# Patient Record
Sex: Female | Born: 1975 | Race: Black or African American | Hispanic: No | Marital: Single | Smoking: Former smoker
Health system: Southern US, Community
[De-identification: ages and names within clinical notes are randomized; demographics above are authoritative.]

## PROBLEM LIST (undated history)

## (undated) ENCOUNTER — Inpatient Hospital Stay (HOSPITAL_COMMUNITY): Payer: Self-pay

## (undated) DIAGNOSIS — R519 Headache, unspecified: Secondary | ICD-10-CM

## (undated) DIAGNOSIS — F32A Depression, unspecified: Secondary | ICD-10-CM

## (undated) DIAGNOSIS — E282 Polycystic ovarian syndrome: Secondary | ICD-10-CM

## (undated) DIAGNOSIS — R51 Headache: Secondary | ICD-10-CM

## (undated) DIAGNOSIS — M5430 Sciatica, unspecified side: Secondary | ICD-10-CM

## (undated) DIAGNOSIS — F329 Major depressive disorder, single episode, unspecified: Secondary | ICD-10-CM

## (undated) DIAGNOSIS — R87629 Unspecified abnormal cytological findings in specimens from vagina: Secondary | ICD-10-CM

## (undated) DIAGNOSIS — O24419 Gestational diabetes mellitus in pregnancy, unspecified control: Secondary | ICD-10-CM

## (undated) DIAGNOSIS — D649 Anemia, unspecified: Secondary | ICD-10-CM

## (undated) DIAGNOSIS — I10 Essential (primary) hypertension: Secondary | ICD-10-CM

## (undated) HISTORY — PX: DILATION AND CURETTAGE OF UTERUS: SHX78

## (undated) HISTORY — DX: Depression, unspecified: F32.A

## (undated) HISTORY — PX: COLPOSCOPY: SHX161

## (undated) HISTORY — DX: Major depressive disorder, single episode, unspecified: F32.9

---

## 1998-02-16 ENCOUNTER — Emergency Department (HOSPITAL_COMMUNITY): Admission: EM | Admit: 1998-02-16 | Discharge: 1998-02-16 | Payer: Self-pay | Admitting: Family Medicine

## 1998-06-04 ENCOUNTER — Emergency Department (HOSPITAL_COMMUNITY): Admission: EM | Admit: 1998-06-04 | Discharge: 1998-06-04 | Payer: Self-pay | Admitting: Emergency Medicine

## 1998-06-04 ENCOUNTER — Encounter: Payer: Self-pay | Admitting: Emergency Medicine

## 1999-10-12 ENCOUNTER — Emergency Department (HOSPITAL_COMMUNITY): Admission: EM | Admit: 1999-10-12 | Discharge: 1999-10-12 | Payer: Self-pay | Admitting: Emergency Medicine

## 2000-07-22 ENCOUNTER — Emergency Department (HOSPITAL_COMMUNITY): Admission: EM | Admit: 2000-07-22 | Discharge: 2000-07-23 | Payer: Self-pay | Admitting: Emergency Medicine

## 2000-08-23 ENCOUNTER — Emergency Department (HOSPITAL_COMMUNITY): Admission: EM | Admit: 2000-08-23 | Discharge: 2000-08-23 | Payer: Self-pay | Admitting: Emergency Medicine

## 2000-11-21 ENCOUNTER — Emergency Department (HOSPITAL_COMMUNITY): Admission: EM | Admit: 2000-11-21 | Discharge: 2000-11-21 | Payer: Self-pay | Admitting: Emergency Medicine

## 2005-09-04 ENCOUNTER — Encounter: Admission: RE | Admit: 2005-09-04 | Discharge: 2005-09-04 | Payer: Self-pay | Admitting: Occupational Medicine

## 2006-03-29 ENCOUNTER — Emergency Department (HOSPITAL_COMMUNITY): Admission: EM | Admit: 2006-03-29 | Discharge: 2006-03-29 | Payer: Self-pay | Admitting: Emergency Medicine

## 2006-08-21 ENCOUNTER — Encounter: Admission: RE | Admit: 2006-08-21 | Discharge: 2006-08-21 | Payer: Self-pay | Admitting: Internal Medicine

## 2007-10-25 ENCOUNTER — Emergency Department (HOSPITAL_COMMUNITY): Admission: EM | Admit: 2007-10-25 | Discharge: 2007-10-25 | Payer: Self-pay | Admitting: Emergency Medicine

## 2008-01-17 ENCOUNTER — Emergency Department (HOSPITAL_COMMUNITY): Admission: EM | Admit: 2008-01-17 | Discharge: 2008-01-17 | Payer: Self-pay | Admitting: Emergency Medicine

## 2008-01-22 ENCOUNTER — Emergency Department (HOSPITAL_COMMUNITY): Admission: EM | Admit: 2008-01-22 | Discharge: 2008-01-22 | Payer: Self-pay | Admitting: Emergency Medicine

## 2008-04-30 ENCOUNTER — Encounter: Payer: Self-pay | Admitting: Gynecology

## 2008-04-30 ENCOUNTER — Other Ambulatory Visit: Admission: RE | Admit: 2008-04-30 | Discharge: 2008-04-30 | Payer: Self-pay | Admitting: Gynecology

## 2008-04-30 ENCOUNTER — Ambulatory Visit: Payer: Self-pay | Admitting: Gynecology

## 2008-05-05 ENCOUNTER — Ambulatory Visit: Payer: Self-pay | Admitting: Gynecology

## 2008-08-13 ENCOUNTER — Emergency Department (HOSPITAL_COMMUNITY): Admission: EM | Admit: 2008-08-13 | Discharge: 2008-08-13 | Payer: Self-pay | Admitting: Emergency Medicine

## 2008-08-20 ENCOUNTER — Ambulatory Visit: Payer: Self-pay | Admitting: Gynecology

## 2008-08-24 ENCOUNTER — Ambulatory Visit: Payer: Self-pay | Admitting: Gynecology

## 2009-05-18 ENCOUNTER — Other Ambulatory Visit: Admission: RE | Admit: 2009-05-18 | Discharge: 2009-05-18 | Payer: Self-pay | Admitting: Gynecology

## 2009-05-18 ENCOUNTER — Ambulatory Visit: Payer: Self-pay | Admitting: Gynecology

## 2009-05-19 ENCOUNTER — Encounter (INDEPENDENT_AMBULATORY_CARE_PROVIDER_SITE_OTHER): Payer: Self-pay | Admitting: *Deleted

## 2009-06-20 ENCOUNTER — Ambulatory Visit: Payer: Self-pay | Admitting: Gastroenterology

## 2009-09-21 ENCOUNTER — Emergency Department (HOSPITAL_COMMUNITY): Admission: EM | Admit: 2009-09-21 | Discharge: 2009-09-21 | Payer: Self-pay | Admitting: Emergency Medicine

## 2009-09-23 ENCOUNTER — Ambulatory Visit: Payer: Self-pay | Admitting: Gynecology

## 2009-10-14 ENCOUNTER — Ambulatory Visit: Payer: Self-pay | Admitting: Gynecology

## 2009-12-30 ENCOUNTER — Emergency Department (HOSPITAL_COMMUNITY): Admission: EM | Admit: 2009-12-30 | Discharge: 2009-12-30 | Payer: Self-pay | Admitting: Emergency Medicine

## 2010-02-23 ENCOUNTER — Emergency Department (HOSPITAL_COMMUNITY): Admission: EM | Admit: 2010-02-23 | Discharge: 2009-09-23 | Payer: Self-pay | Admitting: Emergency Medicine

## 2010-02-23 ENCOUNTER — Emergency Department (HOSPITAL_COMMUNITY): Admission: EM | Admit: 2010-02-23 | Discharge: 2010-01-02 | Payer: Self-pay | Admitting: Emergency Medicine

## 2010-03-19 DIAGNOSIS — O42112 Preterm premature rupture of membranes, onset of labor more than 24 hours following rupture, second trimester: Secondary | ICD-10-CM

## 2010-04-20 NOTE — Letter (Signed)
Summary: New Patient letter  [facility]  [address] [place], [place] [address]   Phone: [phone]  Fax: [phone]       05/19/2009 MRN: [id number]  Alexandra [name] [address] [place], [place]  [address]  Dear Ms. Schiano,  Welcome to the Gastroenterology Division at [facility].    You are scheduled to see Dr.  Christella Hartigan on 06-17-09 at 10:30am on the 3rd floor at [facility], [address]  We ask that you try to arrive at our office 15 minutes prior to your appointment time to allow for check-in.  We would like you to complete the enclosed self-administered evaluation form prior to your visit and bring it with you on the day of your appointment.  We will review it with you.  Also, please bring a complete list of all your medications or, if you prefer, bring the medication bottles and we will list them.  Please bring your insurance card so that we may make a copy of it.  If your insurance requires a referral to see a specialist, please bring your referral form from your primary care physician.  Co-payments are due at the time of your visit and may be paid by cash, check or credit card.     Your office visit will consist of a consult with your physician (includes a physical exam), any laboratory testing he/she may order, scheduling of any necessary diagnostic testing (e.g. x-ray, ultrasound, CT-scan), and scheduling of a procedure (e.g. Endoscopy, Colonoscopy) if required.  Please allow enough time on your schedule to allow for any/all of these possibilities.    If you cannot keep your appointment, please call [phone] to cancel or reschedule prior to your appointment date.  This allows [place] the opportunity to schedule an appointment for another patient in need of care.  If you do not cancel or reschedule by 5 p.m. the business day prior to your appointment date, you will be charged a $50.00 late cancellation/no-show fee.    Thank you for  choosing [facility] for your medical needs.  We appreciate the opportunity to care for you.  Please visit [place] at our website  to learn more about our practice.                     Sincerely,                                                             The Gastroenterology Division

## 2010-05-23 ENCOUNTER — Other Ambulatory Visit (HOSPITAL_COMMUNITY)
Admission: RE | Admit: 2010-05-23 | Discharge: 2010-05-23 | Disposition: A | Discharge status: Home / Self Care | Payer: 59 | Source: Ambulatory Visit | Attending: Gynecology | Admitting: Gynecology

## 2010-05-23 ENCOUNTER — Encounter (INDEPENDENT_AMBULATORY_CARE_PROVIDER_SITE_OTHER): Payer: BC Managed Care – PPO | Admitting: Gynecology

## 2010-05-23 ENCOUNTER — Other Ambulatory Visit: Payer: Self-pay | Admitting: Gynecology

## 2010-05-23 DIAGNOSIS — Z124 Encounter for screening for malignant neoplasm of cervix: Secondary | ICD-10-CM | POA: Insufficient documentation

## 2010-05-23 DIAGNOSIS — Z833 Family history of diabetes mellitus: Secondary | ICD-10-CM

## 2010-05-23 DIAGNOSIS — Z01419 Encounter for gynecological examination (general) (routine) without abnormal findings: Secondary | ICD-10-CM

## 2010-05-23 DIAGNOSIS — Z13220 Encounter for screening for lipoid disorders: Secondary | ICD-10-CM

## 2010-05-23 DIAGNOSIS — R823 Hemoglobinuria: Secondary | ICD-10-CM

## 2010-05-23 DIAGNOSIS — N926 Irregular menstruation, unspecified: Secondary | ICD-10-CM

## 2010-05-23 DIAGNOSIS — B373 Candidiasis of vulva and vagina: Secondary | ICD-10-CM

## 2010-05-31 LAB — BASIC METABOLIC PANEL
CO2: 27 mEq/L (ref 19–32)
Chloride: 106 mEq/L (ref 96–112)
Creatinine, Ser: 0.81 mg/dL (ref 0.4–1.2)
GFR calc Af Amer: >60 mL/min (ref >60)

## 2010-05-31 LAB — CBC
MCH: 28.9 pg (ref 26.0–34.0)
MCV: 86.8 fL (ref 78.0–100.0)
Platelets: 215 10*3/uL (ref 150–400)
RBC: 4.46 MIL/uL (ref 3.87–5.11)

## 2010-05-31 LAB — URINALYSIS, ROUTINE W REFLEX MICROSCOPIC
Bilirubin Urine: NEGATIVE
Glucose, UA: NEGATIVE mg/dL
Hgb urine dipstick: NEGATIVE
Ketones, ur: NEGATIVE mg/dL
Protein, ur: NEGATIVE mg/dL

## 2010-05-31 LAB — PREGNANCY, URINE: Preg Test, Ur: NEGATIVE

## 2010-05-31 LAB — TROPONIN I: Troponin I: 0.02 ng/mL (ref 0.00–0.06)

## 2010-05-31 LAB — DIFFERENTIAL
Basophils Absolute: 0.1 10*3/uL (ref 0.0–0.1)
Basophils Relative: 1 % (ref 0–1)
Eosinophils Absolute: 0.1 10*3/uL (ref 0.0–0.7)
Eosinophils Relative: 1 % (ref 0–5)
Lymphocytes Relative: 22 % (ref 12–46)
Lymphs Abs: 1.9 10*3/uL (ref 0.7–4.0)
Monocytes Absolute: 0.5 10*3/uL (ref 0.1–1.0)
Monocytes Relative: 6 % (ref 3–12)
Neutro Abs: 6.1 10*3/uL (ref 1.7–7.7)
Neutrophils Relative %: 70 % (ref 43–77)

## 2010-05-31 LAB — D-DIMER, QUANTITATIVE: D-Dimer, Quant: <0.22 ug/mL-FEU (ref 0.00–0.48)

## 2010-06-04 LAB — STREP A DNA PROBE

## 2010-06-27 LAB — URINE MICROSCOPIC-ADD ON

## 2010-06-27 LAB — GC/CHLAMYDIA PROBE AMP, GENITAL: Chlamydia, DNA Probe: NEGATIVE

## 2010-06-27 LAB — URINALYSIS, ROUTINE W REFLEX MICROSCOPIC
Bilirubin Urine: NEGATIVE
Glucose, UA: NEGATIVE mg/dL
Ketones, ur: NEGATIVE mg/dL
pH: 6 (ref 5.0–8.0)

## 2010-06-27 LAB — CBC
MCHC: 32.5 g/dL (ref 30.0–36.0)
RBC: 4.68 MIL/uL (ref 3.87–5.11)
WBC: 7.3 10*3/uL (ref 4.0–10.5)

## 2010-06-27 LAB — URINE CULTURE: Culture: NO GROWTH

## 2010-06-27 LAB — WET PREP, GENITAL

## 2010-12-15 LAB — PREGNANCY, URINE: Preg Test, Ur: NEGATIVE

## 2010-12-15 LAB — URINE MICROSCOPIC-ADD ON

## 2010-12-15 LAB — WET PREP, GENITAL
Trich, Wet Prep: NONE SEEN
Yeast Wet Prep HPF POC: NONE SEEN

## 2010-12-15 LAB — POCT I-STAT, CHEM 8
BUN: 16
Calcium, Ion: 1.18
Creatinine, Ser: 1
Sodium: 137
TCO2: 26

## 2010-12-15 LAB — URINALYSIS, ROUTINE W REFLEX MICROSCOPIC
Ketones, ur: NEGATIVE
Leukocytes, UA: NEGATIVE
Nitrite: NEGATIVE
Urobilinogen, UA: 0.2
pH: 5.5

## 2010-12-15 LAB — PROTIME-INR: INR: 0.9

## 2010-12-15 LAB — APTT: aPTT: 31

## 2013-03-02 ENCOUNTER — Encounter (HOSPITAL_COMMUNITY): Payer: Self-pay | Admitting: Emergency Medicine

## 2013-03-02 DIAGNOSIS — Z3202 Encounter for pregnancy test, result negative: Secondary | ICD-10-CM | POA: Insufficient documentation

## 2013-03-02 DIAGNOSIS — H53149 Visual discomfort, unspecified: Secondary | ICD-10-CM | POA: Insufficient documentation

## 2013-03-02 DIAGNOSIS — F17200 Nicotine dependence, unspecified, uncomplicated: Secondary | ICD-10-CM | POA: Insufficient documentation

## 2013-03-02 DIAGNOSIS — J329 Chronic sinusitis, unspecified: Secondary | ICD-10-CM | POA: Insufficient documentation

## 2013-03-02 DIAGNOSIS — J3489 Other specified disorders of nose and nasal sinuses: Secondary | ICD-10-CM | POA: Insufficient documentation

## 2013-03-02 DIAGNOSIS — R5381 Other malaise: Secondary | ICD-10-CM | POA: Insufficient documentation

## 2013-03-02 DIAGNOSIS — R197 Diarrhea, unspecified: Secondary | ICD-10-CM | POA: Insufficient documentation

## 2013-03-02 DIAGNOSIS — R112 Nausea with vomiting, unspecified: Secondary | ICD-10-CM | POA: Insufficient documentation

## 2013-03-02 LAB — CBC WITH DIFFERENTIAL/PLATELET
Eosinophils Absolute: 0 10*3/uL (ref 0.0–0.7)
Eosinophils Relative: 0 % (ref 0–5)
Lymphs Abs: 0.6 10*3/uL — ABNORMAL LOW (ref 0.7–4.0)
MCH: 29.4 pg (ref 26.0–34.0)
MCV: 86.9 fL (ref 78.0–100.0)
Monocytes Absolute: 1 10*3/uL (ref 0.1–1.0)
Platelets: 148 10*3/uL — ABNORMAL LOW (ref 150–400)
RBC: 4.29 MIL/uL (ref 3.87–5.11)

## 2013-03-02 LAB — BASIC METABOLIC PANEL
BUN: 6 mg/dL (ref 6–23)
Calcium: 8.5 mg/dL (ref 8.4–10.5)
Creatinine, Ser: 0.76 mg/dL (ref 0.50–1.10)
GFR calc non Af Amer: >90 mL/min (ref >90)
Glucose, Bld: 97 mg/dL (ref 70–99)
Sodium: 134 mEq/L — ABNORMAL LOW (ref 135–145)

## 2013-03-02 MED ORDER — SODIUM CHLORIDE 0.9 % IV SOLN
Freq: Once | INTRAVENOUS | Status: AC
  Administered 2013-03-03: 01:00:00 via INTRAVENOUS

## 2013-03-02 NOTE — ED Notes (Signed)
Patient presents with c/o headache, flu like symptoms, weakness

## 2013-03-03 ENCOUNTER — Emergency Department (HOSPITAL_COMMUNITY)
Admission: EM | Admit: 2013-03-03 | Discharge: 2013-03-03 | Disposition: A | Discharge status: Home / Self Care | Payer: Self-pay | Attending: Emergency Medicine | Admitting: Emergency Medicine

## 2013-03-03 ENCOUNTER — Emergency Department (HOSPITAL_COMMUNITY): Payer: Self-pay

## 2013-03-03 DIAGNOSIS — J329 Chronic sinusitis, unspecified: Secondary | ICD-10-CM

## 2013-03-03 HISTORY — DX: Headache: R51

## 2013-03-03 HISTORY — DX: Headache, unspecified: R51.9

## 2013-03-03 LAB — HEPATIC FUNCTION PANEL
ALT: 9 U/L (ref 0–35)
AST: 16 U/L (ref 0–37)
Alkaline Phosphatase: 51 U/L (ref 39–117)
Bilirubin, Direct: <0.1 mg/dL (ref 0.0–0.3)

## 2013-03-03 LAB — URINALYSIS, ROUTINE W REFLEX MICROSCOPIC
Bilirubin Urine: NEGATIVE
Ketones, ur: 40 mg/dL — AB
Protein, ur: NEGATIVE mg/dL
Urobilinogen, UA: 1 mg/dL (ref 0.0–1.0)

## 2013-03-03 MED ORDER — METOCLOPRAMIDE HCL 5 MG/ML IJ SOLN
10.0000 mg | Freq: Once | INTRAMUSCULAR | Status: AC
  Administered 2013-03-03: 10 mg via INTRAVENOUS
  Filled 2013-03-03: qty 2

## 2013-03-03 MED ORDER — AZITHROMYCIN 250 MG PO TABS: ORAL_TABLET | ORAL | Status: DC

## 2013-03-03 MED ORDER — MOMETASONE FUROATE 50 MCG/ACT NA SUSP: 2.0000 | Freq: Every day | NASAL | Status: DC

## 2013-03-03 MED ORDER — KETOROLAC TROMETHAMINE 30 MG/ML IJ SOLN
30.0000 mg | Freq: Once | INTRAMUSCULAR | Status: AC
  Administered 2013-03-03: 30 mg via INTRAVENOUS
  Filled 2013-03-03: qty 1

## 2013-03-03 MED ORDER — IBUPROFEN 600 MG PO TABS: 600.0000 mg | ORAL_TABLET | Freq: Four times a day (QID) | ORAL | Status: DC | PRN

## 2013-03-03 MED ORDER — SODIUM CHLORIDE 0.9 % IV BOLUS (SEPSIS)
1000.0000 mL | Freq: Once | INTRAVENOUS | Status: AC
  Administered 2013-03-03: 1000 mL via INTRAVENOUS

## 2013-03-03 MED ORDER — OXYMETAZOLINE HCL 0.05 % NA SOLN: 1.0000 | Freq: Two times a day (BID) | NASAL | Status: DC

## 2013-03-03 MED ORDER — MORPHINE SULFATE 2 MG/ML IJ SOLN
2.0000 mg | Freq: Once | INTRAMUSCULAR | Status: AC
  Administered 2013-03-03: 2 mg via INTRAVENOUS
  Filled 2013-03-03: qty 1

## 2013-03-03 NOTE — ED Notes (Signed)
Patient transported to X-ray 

## 2013-03-03 NOTE — ED Provider Notes (Signed)
CSN: [id number]     Arrival date & time 03/02/13  2202 History   First MD Initiated Contact with Patient 03/03/13 0049     Chief Complaint  Patient presents with  . Headache  . Weakness   (Consider location/radiation/quality/duration/timing/severity/associated sxs/prior Treatment) HPI Patient presents with to 3 days of nasal congestion, sinus pressure, sore throat and cough. The cough is nonproductive and she has no fever. She does have some chest wall pain that is worse when coughing. She began to have multiple episodes of diarrhea and vomiting yesterday. She states that she developed a gradual onset bitemporal and facial headache. She does have some mild photophobia. The headache is much worse when she is bending forward. She has no neck pain or stiffness. She has no focal weakness or numbness although she does state that the area over her maxillary sinuses tingles. Her significant other has been sick with similar symptoms. Past Medical History  Diagnosis Date  . Frequent headaches    History reviewed. No pertinent past surgical history. History reviewed. No pertinent family history. History  Substance Use Topics  . Smoking status: Current Some Day Smoker  . Smokeless tobacco: Not on file  . Alcohol Use: Yes     Comment: ocassionally   OB History   Grav Para Term Preterm Abortions TAB SAB Ect Mult Living                 Review of Systems  Constitutional: Positive for fatigue. Negative for fever and chills.  HENT: Positive for congestion, rhinorrhea, sinus pressure and sore throat. Negative for facial swelling, trouble swallowing and voice change.   Eyes: Positive for photophobia.  Respiratory: Positive for cough. Negative for shortness of breath and wheezing.   Cardiovascular: Negative for chest pain, palpitations and leg swelling.  Gastrointestinal: Positive for nausea, vomiting and diarrhea. Negative for abdominal pain and blood in stool.  Genitourinary: Negative for dysuria,  hematuria and flank pain.  Musculoskeletal: Negative for back pain, myalgias, neck pain and neck stiffness.  Skin: Negative for rash and wound.  Neurological: Positive for headaches. Negative for dizziness, syncope, weakness, light-headedness and numbness.  All other systems reviewed and are negative.    Allergies  Review of patient's allergies indicates no known allergies.  Home Medications  No current outpatient prescriptions on file. BP 144/100  Pulse 103  Temp(Src) 98.5 F (36.9 C) (Oral)  Resp 22  Ht 6' (1.829 m)  Wt 165 lb 8 oz (75.07 kg)  BMI 22.44 kg/m2  SpO2 97%  LMP 02/16/2013 Physical Exam  Nursing note and vitals reviewed. Constitutional: She is oriented to person, place, and time. She appears well-developed and well-nourished. No distress.  HENT:  Head: Normocephalic and atraumatic.  Mouth/Throat: Oropharynx is clear and moist. No oropharyngeal exudate.  Bilateral nasal mucosal edema. Tenderness to percussion over bilateral maxillary sinuses.  Eyes: EOM are normal. Pupils are equal, round, and reactive to light.  Neck: Normal range of motion. Neck supple.  No meningismus  Cardiovascular: Normal rate and regular rhythm.   Pulmonary/Chest: Effort normal and breath sounds normal. No respiratory distress. She has no wheezes. She has no rales. She exhibits tenderness (mild tenderness to palpation over the left parasternal border. No crepitance or deformity.).  Abdominal: Soft. Bowel sounds are normal. She exhibits no distension and no mass. There is no tenderness. There is no rebound and no guarding.  Musculoskeletal: Normal range of motion. She exhibits no edema and no tenderness.  No lower extremity swelling or tenderness  Neurological: She is alert and oriented to person, place, and time.  Patient is alert and oriented x3 with clear, goal oriented speech. Patient has 5/5 motor in all extremities. Sensation is intact to light touch.    Skin: Skin is warm and dry. No  rash noted. No erythema.  Psychiatric: She has a normal mood and affect. Her behavior is normal.    ED Course  Procedures (including critical care time) Labs Review Labs Reviewed  CBC WITH DIFFERENTIAL - Abnormal; Notable for the following:    Platelets 148 (*)    Lymphs Abs 0.6 (*)    Monocytes Relative 23 (*)    All other components within normal limits  BASIC METABOLIC PANEL - Abnormal; Notable for the following:    Sodium 134 (*)    Potassium 3.4 (*)    All other components within normal limits  URINALYSIS, ROUTINE W REFLEX MICROSCOPIC  PREGNANCY, URINE  HEPATIC FUNCTION PANEL   Imaging Review No results found.  EKG Interpretation   None       MDM   Patient's symptoms have improved in the emergency department. Neurologic exam remains normal. We'll treat for sinusitis. Return precautions have been given.  Loren Racer, MD 03/03/13 [phone]

## 2013-03-03 NOTE — ED Notes (Signed)
Family at bedside. 

## 2013-10-09 ENCOUNTER — Ambulatory Visit: Payer: Self-pay | Admitting: Women's Health

## 2013-10-28 ENCOUNTER — Emergency Department (HOSPITAL_COMMUNITY)
Admission: EM | Admit: 2013-10-28 | Discharge: 2013-10-28 | Disposition: A | Discharge status: Home / Self Care | Payer: 59 | Attending: Emergency Medicine | Admitting: Emergency Medicine

## 2013-10-28 ENCOUNTER — Encounter (HOSPITAL_COMMUNITY): Payer: Self-pay | Admitting: Emergency Medicine

## 2013-10-28 DIAGNOSIS — Z792 Long term (current) use of antibiotics: Secondary | ICD-10-CM | POA: Insufficient documentation

## 2013-10-28 DIAGNOSIS — Z79899 Other long term (current) drug therapy: Secondary | ICD-10-CM | POA: Diagnosis not present

## 2013-10-28 DIAGNOSIS — S61209A Unspecified open wound of unspecified finger without damage to nail, initial encounter: Secondary | ICD-10-CM | POA: Insufficient documentation

## 2013-10-28 DIAGNOSIS — IMO0002 Reserved for concepts with insufficient information to code with codable children: Secondary | ICD-10-CM | POA: Insufficient documentation

## 2013-10-28 DIAGNOSIS — Z23 Encounter for immunization: Secondary | ICD-10-CM | POA: Diagnosis not present

## 2013-10-28 DIAGNOSIS — W269XXA Contact with unspecified sharp object(s), initial encounter: Secondary | ICD-10-CM | POA: Diagnosis not present

## 2013-10-28 DIAGNOSIS — S61219A Laceration without foreign body of unspecified finger without damage to nail, initial encounter: Secondary | ICD-10-CM

## 2013-10-28 DIAGNOSIS — Y9289 Other specified places as the place of occurrence of the external cause: Secondary | ICD-10-CM | POA: Diagnosis not present

## 2013-10-28 DIAGNOSIS — Y9389 Activity, other specified: Secondary | ICD-10-CM | POA: Insufficient documentation

## 2013-10-28 DIAGNOSIS — F17200 Nicotine dependence, unspecified, uncomplicated: Secondary | ICD-10-CM | POA: Diagnosis not present

## 2013-10-28 MED ORDER — NAPROXEN 500 MG PO TABS: 500.0000 mg | ORAL_TABLET | Freq: Two times a day (BID) | ORAL | Status: DC

## 2013-10-28 MED ORDER — TETANUS-DIPHTH-ACELL PERTUSSIS 5-2.5-18.5 LF-MCG/0.5 IM SUSP
0.5000 mL | Freq: Once | INTRAMUSCULAR | Status: AC
  Administered 2013-10-28: 0.5 mL via INTRAMUSCULAR
  Filled 2013-10-28: qty 0.5

## 2013-10-28 MED ORDER — BUPIVACAINE HCL (PF) 0.5 % IJ SOLN
10.0000 mL | Freq: Once | INTRAMUSCULAR | Status: DC
  Filled 2013-10-28: qty 10

## 2013-10-28 NOTE — ED Provider Notes (Signed)
CSN: [id number]     Arrival date & time 10/28/13  0830 History   First MD Initiated Contact with Patient 10/28/13 [phone]     Chief Complaint  Patient presents with  . Laceration     (Consider location/radiation/quality/duration/timing/severity/associated sxs/prior Treatment) HPI Alexandra [name] is a 38 y.o. female who presents to emergency department complaining of a laceration to the finger. Patient states that she cut it last evening around 8 PM on the window. She states that she's unable to keep the bleeding under control. States she's having pain to the laceration to the area. She denies any numbness or weakness distally. No difficulty moving finger. She has applied bandages to his, no other treatments tried.  Past Medical History  Diagnosis Date  . Frequent headaches    History reviewed. No pertinent past surgical history. History reviewed. No pertinent family history. History  Substance Use Topics  . Smoking status: Current Some Day Smoker  . Smokeless tobacco: Not on file  . Alcohol Use: Yes     Comment: ocassionally   OB History   Grav Para Term Preterm Abortions TAB SAB Ect Mult Living                 Review of Systems  Musculoskeletal: Positive for arthralgias.  Skin: Positive for wound.  Neurological: Negative for weakness and numbness.      Allergies  Lortab  Home Medications   Prior to Admission medications   Medication Sig Start Date End Date Taking? Authorizing Provider  Aspirin-Acetaminophen-Caffeine (GOODY HEADACHE PO) Take 1 Package by mouth daily as needed.    Historical Provider, MD  azithromycin (ZITHROMAX Z-PAK) 250 MG tablet 2 po day one, then 1 daily x 4 days 03/03/13   Loren Raceravid Yelverton, MD  ibuprofen (ADVIL,MOTRIN) 600 MG tablet Take 1 tablet (600 mg total) by mouth every 6 (six) hours as needed. 03/03/13   Loren Raceravid Yelverton, MD  mometasone (NASONEX) 50 MCG/ACT nasal spray Place 2 sprays into the nose daily. 03/03/13   Loren Raceravid Yelverton, MD   naproxen sodium (ANAPROX) 220 MG tablet Take 220 mg by mouth daily as needed (pain).    Historical Provider, MD  oxymetazoline (AFRIN NASAL SPRAY) 0.05 % nasal spray Place 1 spray into both nostrils 2 (two) times daily. 03/03/13   Loren Raceravid Yelverton, MD   BP 167/97  Pulse 90  Temp(Src) 97.6 F (36.4 C) (Oral)  Resp 18  SpO2 100%  LMP 10/20/2013 Physical Exam  Nursing note and vitals reviewed. Constitutional: She appears well-developed and well-nourished. No distress.  HENT:  Head: Normocephalic.  Eyes: Conjunctivae are normal.  Neck: Neck supple.  Cardiovascular: Normal rate.   Musculoskeletal: She exhibits no edema.  Neurological: She is alert.  Skin: Skin is warm and dry.  1 cm laceration to the dorsal aspect of the right middle finger over the PIP joint. Full range of motion of the finger, strength intact with flexion extension against resistance at all joints. Sensation intact distally. Cap refill less than 2 seconds distally to  Psychiatric: She has a normal mood and affect. Her behavior is normal.    ED Course  Procedures (including critical care time) Labs Review Labs Reviewed - No data to display  Imaging Review No results found.   EKG Interpretation None      LACERATION REPAIR Performed by: Lottie MusselKIRICHENKO, Makeya Hilgert A Authorized by: Jaynie CrumbleKIRICHENKO, Aylan Bayona A Consent: Verbal consent obtained. Risks and benefits: risks, benefits and alternatives were discussed Consent given by: patient Patient identity confirmed: provided demographic data  Prepped and Draped in normal sterile fashion Wound explored  Laceration Location: right middle finger  Laceration Length: 1cm  No Foreign Bodies seen or palpated  Anesthesia: local infiltration  Local anesthetic: lidocaine 2% wo epinephrine  Anesthetic total: 1 ml  Irrigation method: syringe Amount of cleaning: standard  Skin closure: prolene 5.0  Number of sutures: 3  Technique: simple interrupted  Patient tolerance:  Patient tolerated the procedure well with no immediate complications.  MDM   Final diagnoses:  Laceration of finger, initial encounter    Patient with small laceration to the dorsal right middle finger, over PIP joint. Based on examination no tendon, nerve, vascular injury. Wound. Poorly giving this happened yesterday. Closed with sutures. Lysed in a splint, since over the joint, will followup in 7-10 days for suture removal.  Filed Vitals:   10/28/13 0837 10/28/13 1016  BP: 167/97 130/100  Pulse: 90 75  Temp: 97.6 F (36.4 C) 98.1 F (36.7 C)  TempSrc: Oral Oral  Resp: 18 16  SpO2: 100% 99%        Alexandra Olshefski A Alexias Margerum, PA-C 10/28/13 1708

## 2013-10-28 NOTE — Discharge Instructions (Signed)
Keep splinted, elevate. Bacitracin topically twice a day. Follow up in 1 week for suture removal.   Laceration Care, Adult A laceration is a cut or lesion that goes through all layers of the skin and into the tissue just beneath the skin. TREATMENT  Some lacerations may not require closure. Some lacerations may not be able to be closed due to an increased risk of infection. It is important to see your caregiver as soon as possible after an injury to minimize the risk of infection and maximize the opportunity for successful closure. If closure is appropriate, pain medicines may be given, if needed. The wound will be cleaned to help prevent infection. Your caregiver will use stitches (sutures), staples, wound glue (adhesive), or skin adhesive strips to repair the laceration. These tools bring the skin edges together to allow for faster healing and a better cosmetic outcome. However, all wounds will heal with a scar. Once the wound has healed, scarring can be minimized by covering the wound with sunscreen during the day for 1 full year. HOME CARE INSTRUCTIONS  For sutures or staples:  Keep the wound clean and dry.  If you were given a bandage (dressing), you should change it at least once a day. Also, change the dressing if it becomes wet or dirty, or as directed by your caregiver.  Wash the wound with soap and water 2 times a day. Rinse the wound off with water to remove all soap. Pat the wound dry with a clean towel.  After cleaning, apply a thin layer of the antibiotic ointment as recommended by your caregiver. This will help prevent infection and keep the dressing from sticking.  You may shower as usual after the first 24 hours. Do not soak the wound in water until the sutures are removed.  Only take over-the-counter or prescription medicines for pain, discomfort, or fever as directed by your caregiver.  Get your sutures or staples removed as directed by your caregiver. For skin adhesive  strips:  Keep the wound clean and dry.  Do not get the skin adhesive strips wet. You may bathe carefully, using caution to keep the wound dry.  If the wound gets wet, pat it dry with a clean towel.  Skin adhesive strips will fall off on their own. You may trim the strips as the wound heals. Do not remove skin adhesive strips that are still stuck to the wound. They will fall off in time. For wound adhesive:  You may briefly wet your wound in the shower or bath. Do not soak or scrub the wound. Do not swim. Avoid periods of heavy perspiration until the skin adhesive has fallen off on its own. After showering or bathing, gently pat the wound dry with a clean towel.  Do not apply liquid medicine, cream medicine, or ointment medicine to your wound while the skin adhesive is in place. This may loosen the film before your wound is healed.  If a dressing is placed over the wound, be careful not to apply tape directly over the skin adhesive. This may cause the adhesive to be pulled off before the wound is healed.  Avoid prolonged exposure to sunlight or tanning lamps while the skin adhesive is in place. Exposure to ultraviolet light in the first year will darken the scar.  The skin adhesive will usually remain in place for 5 to 10 days, then naturally fall off the skin. Do not pick at the adhesive film. You may need a tetanus shot if:  You cannot remember when you had your last tetanus shot.  You have never had a tetanus shot. If you get a tetanus shot, your arm may swell, get red, and feel warm to the touch. This is common and not a problem. If you need a tetanus shot and you choose not to have one, there is a rare chance of getting tetanus. Sickness from tetanus can be serious. SEEK MEDICAL CARE IF:   You have redness, swelling, or increasing pain in the wound.  You see a red line that goes away from the wound.  You have yellowish-white fluid (pus) coming from the wound.  You have a  fever.  You notice a bad smell coming from the wound or dressing.  Your wound breaks open before or after sutures have been removed.  You notice something coming out of the wound such as wood or glass.  Your wound is on your hand or foot and you cannot move a finger or toe. SEEK IMMEDIATE MEDICAL CARE IF:   Your pain is not controlled with prescribed medicine.  You have severe swelling around the wound causing pain and numbness or a change in color in your arm, hand, leg, or foot.  Your wound splits open and starts bleeding.  You have worsening numbness, weakness, or loss of function of any joint around or beyond the wound.  You develop painful lumps near the wound or on the skin anywhere on your body. MAKE SURE YOU:   Understand these instructions.  Will watch your condition.  Will get help right away if you are not doing well or get worse. Document Released: 03/05/2005 Document Revised: 05/28/2011 Document Reviewed: 08/29/2010 [facility] Patient Information 2015 [place], [place]. This information is not intended to replace advice given to you by your health care provider. Make sure you discuss any questions you have with your health care provider.

## 2013-10-28 NOTE — ED Notes (Signed)
PA in suturing pt.

## 2013-10-28 NOTE — ED Notes (Signed)
Per pt sts that she cut right middle finger on window about 8 pm yesterday. sts that she has changed the bandage multiple times and has bled through. Bleeding currently controlled. sts the finger is stinging.

## 2013-10-29 NOTE — ED Provider Notes (Signed)
Medical screening examination/treatment/procedure(s) were performed by non-physician practitioner and as supervising physician I was immediately available for consultation/collaboration.   EKG Interpretation None        Mimie Goering, DO 10/29/13 1649 

## 2013-11-19 ENCOUNTER — Ambulatory Visit: Payer: Self-pay | Admitting: Gynecology

## 2013-12-02 ENCOUNTER — Ambulatory Visit: Payer: Self-pay | Admitting: Gynecology

## 2014-01-22 DIAGNOSIS — O0990 Supervision of high risk pregnancy, unspecified, unspecified trimester: Secondary | ICD-10-CM | POA: Insufficient documentation

## 2014-01-27 ENCOUNTER — Emergency Department (HOSPITAL_COMMUNITY)
Admission: EM | Admit: 2014-01-27 | Discharge: 2014-01-27 | Discharge status: Against Medical Advice | Payer: 59 | Attending: Emergency Medicine | Admitting: Emergency Medicine

## 2014-01-27 ENCOUNTER — Encounter (HOSPITAL_COMMUNITY): Payer: Self-pay | Admitting: *Deleted

## 2014-01-27 DIAGNOSIS — Z0000 Encounter for general adult medical examination without abnormal findings: Secondary | ICD-10-CM | POA: Insufficient documentation

## 2014-01-27 DIAGNOSIS — F17210 Nicotine dependence, cigarettes, uncomplicated: Secondary | ICD-10-CM | POA: Insufficient documentation

## 2014-01-27 DIAGNOSIS — Z3A00 Weeks of gestation of pregnancy not specified: Secondary | ICD-10-CM | POA: Diagnosis not present

## 2014-01-27 DIAGNOSIS — O99330 Smoking (tobacco) complicating pregnancy, unspecified trimester: Secondary | ICD-10-CM | POA: Insufficient documentation

## 2014-01-27 NOTE — ED Notes (Signed)
Pt has left the ED without being seen by a physician.

## 2014-01-27 NOTE — ED Notes (Signed)
Pt observed by RN leaving the ED; unsure of return; Md notified

## 2014-01-27 NOTE — ED Notes (Signed)
The pt saw her doctor in [place] on Friday and has a pos preg test.  She had blood drawn and [place] in [place] and was contacted today and sent here to get labs drawn  lmp oct 18

## 2014-01-27 NOTE — ED Provider Notes (Signed)
Pt presenting for lab drawn in setting of reported + pregnancy test. Pt LWBS prior to evaluation by myself.   [name] [name], MD 01/27/14 [phone]  Dione Boozeavid Glick, MD 01/27/14 [phone]

## 2014-02-03 ENCOUNTER — Inpatient Hospital Stay (HOSPITAL_COMMUNITY): Payer: 59

## 2014-02-03 ENCOUNTER — Encounter (HOSPITAL_COMMUNITY): Payer: Self-pay | Admitting: *Deleted

## 2014-02-03 ENCOUNTER — Inpatient Hospital Stay (HOSPITAL_COMMUNITY)
Admission: AD | Admit: 2014-02-03 | Discharge: 2014-02-03 | Disposition: A | Discharge status: Home / Self Care | Payer: 59 | Source: Ambulatory Visit | Attending: Obstetrics and Gynecology | Admitting: Obstetrics and Gynecology

## 2014-02-03 DIAGNOSIS — Z3A01 Less than 8 weeks gestation of pregnancy: Secondary | ICD-10-CM | POA: Diagnosis not present

## 2014-02-03 DIAGNOSIS — O99331 Smoking (tobacco) complicating pregnancy, first trimester: Secondary | ICD-10-CM | POA: Diagnosis not present

## 2014-02-03 DIAGNOSIS — O26891 Other specified pregnancy related conditions, first trimester: Secondary | ICD-10-CM | POA: Diagnosis not present

## 2014-02-03 DIAGNOSIS — O209 Hemorrhage in early pregnancy, unspecified: Secondary | ICD-10-CM | POA: Insufficient documentation

## 2014-02-03 LAB — CBC WITH DIFFERENTIAL/PLATELET
BASOS ABS: 0.1 10*3/uL (ref 0.0–0.1)
BASOS PCT: 1 % (ref 0–1)
EOS PCT: 3 % (ref 0–5)
Eosinophils Absolute: 0.3 10*3/uL (ref 0.0–0.7)
HEMATOCRIT: 37.1 % (ref 36.0–46.0)
Hemoglobin: 12.5 g/dL (ref 12.0–15.0)
Lymphocytes Relative: 42 % (ref 12–46)
Lymphs Abs: 4.4 10*3/uL — ABNORMAL HIGH (ref 0.7–4.0)
MCH: 29.2 pg (ref 26.0–34.0)
MCHC: 33.7 g/dL (ref 30.0–36.0)
MCV: 86.7 fL (ref 78.0–100.0)
MONO ABS: 0.8 10*3/uL (ref 0.1–1.0)
Monocytes Relative: 7 % (ref 3–12)
NEUTROS ABS: 5 10*3/uL (ref 1.7–7.7)
Neutrophils Relative %: 47 % (ref 43–77)
Platelets: 244 10*3/uL (ref 150–400)
RBC: 4.28 MIL/uL (ref 3.87–5.11)
RDW: 13.4 % (ref 11.5–15.5)
WBC: 10.6 10*3/uL — ABNORMAL HIGH (ref 4.0–10.5)

## 2014-02-03 LAB — URINE MICROSCOPIC-ADD ON

## 2014-02-03 LAB — POCT PREGNANCY, URINE: Preg Test, Ur: POSITIVE — AB

## 2014-02-03 LAB — URINALYSIS, ROUTINE W REFLEX MICROSCOPIC
Bilirubin Urine: NEGATIVE
GLUCOSE, UA: NEGATIVE mg/dL
KETONES UR: NEGATIVE mg/dL
LEUKOCYTES UA: NEGATIVE
Nitrite: NEGATIVE
PROTEIN: NEGATIVE mg/dL
Specific Gravity, Urine: 1.01 (ref 1.005–1.030)
UROBILINOGEN UA: 0.2 mg/dL (ref 0.0–1.0)
pH: 5.5 (ref 5.0–8.0)

## 2014-02-03 LAB — WET PREP, GENITAL
Clue Cells Wet Prep HPF POC: NONE SEEN
Trich, Wet Prep: NONE SEEN
WBC WET PREP: NONE SEEN
Yeast Wet Prep HPF POC: NONE SEEN

## 2014-02-03 LAB — HCG, QUANTITATIVE, PREGNANCY: HCG, BETA CHAIN, QUANT, S: 4489 m[IU]/mL — AB (ref <5)

## 2014-02-03 LAB — OB RESULTS CONSOLE GC/CHLAMYDIA
Chlamydia: NEGATIVE
GC PROBE AMP, GENITAL: NEGATIVE

## 2014-02-03 NOTE — Discharge Instructions (Signed)
First Trimester of Pregnancy The first trimester of pregnancy is from week 1 until the end of week 12 (months 1 through 3). During this time, your baby will begin to develop inside you. At 6-8 weeks, the eyes and face are formed, and the heartbeat can be seen on ultrasound. At the end of 12 weeks, all the baby's organs are formed. Prenatal care is all the medical care you receive before the birth of your baby. Make sure you get good prenatal care and follow all of your doctor's instructions. HOME CARE  Medicines  Take medicine only as told by your doctor. Some medicines are safe and some are not during pregnancy.  Take your prenatal vitamins as told by your doctor.  Take medicine that helps you poop (stool softener) as needed if your doctor says it is okay. Diet  Eat regular, healthy meals.  Your doctor will tell you the amount of weight gain that is right for you.  Avoid raw meat and uncooked cheese.  If you feel sick to your stomach (nauseous) or throw up (vomit):  Eat 4 or 5 small meals a day instead of 3 large meals.  Try eating a few soda crackers.  Drink liquids between meals instead of during meals.  If you have a hard time pooping (constipation):  Eat high-fiber foods like fresh vegetables, fruit, and whole grains.  Drink enough fluids to keep your pee (urine) clear or pale yellow. Activity and Exercise  Exercise only as told by your doctor. Stop exercising if you have cramps or pain in your lower belly (abdomen) or low back.  Try to avoid standing for long periods of time. Move your legs often if you must stand in one place for a long time.  Avoid heavy lifting.  Wear low-heeled shoes. Sit and stand up straight.  You can have sex unless your doctor tells you not to. Relief of Pain or Discomfort  Wear a good support bra if your breasts are sore.  Take warm water baths (sitz baths) to soothe pain or discomfort caused by hemorrhoids. Use hemorrhoid cream if your  doctor says it is okay.  Rest with your legs raised if you have leg cramps or low back pain.  Wear support hose if you have puffy, bulging veins (varicose veins) in your legs. Raise (elevate) your feet for 15 minutes, 3-4 times a day. Limit salt in your diet. Prenatal Care  Schedule your prenatal visits by the twelfth week of pregnancy.  Write down your questions. Take them to your prenatal visits.  Keep all your prenatal visits as told by your doctor. Safety  Wear your seat belt at all times when driving.  Make a list of emergency phone numbers. The list should include numbers for family, friends, the hospital, and police and fire departments. General Tips  Ask your doctor for a referral to a local prenatal class. Begin classes no later than at the start of month 6 of your pregnancy.  Ask for help if you need counseling or help with nutrition. Your doctor can give you advice or tell you where to go for help.  Do not use hot tubs, steam rooms, or saunas.  Do not douche or use tampons or scented sanitary pads.  Do not cross your legs for long periods of time.  Avoid litter boxes and soil used by cats.  Avoid all smoking, herbs, and alcohol. Avoid drugs not approved by your doctor.  Visit your dentist. At home, brush your teeth   with a soft toothbrush. Be gentle when you floss. GET HELP IF:  You are dizzy.  You have mild cramps or pressure in your lower belly.  You have a nagging pain in your belly area.  You continue to feel sick to your stomach, throw up, or have watery poop (diarrhea).  You have a bad smelling fluid coming from your vagina.  You have pain with peeing (urination).  You have increased puffiness (swelling) in your face, hands, legs, or ankles. GET HELP RIGHT AWAY IF:   You have a fever.  You are leaking fluid from your vagina.  You have spotting or bleeding from your vagina.  You have very bad belly cramping or pain.  You gain or lose weight  rapidly.  You throw up blood. It may look like coffee grounds.  You are around people who have German measles, fifth disease, or chickenpox.  You have a very bad headache.  You have shortness of breath.  You have any kind of trauma, such as from a fall or a car accident. Document Released: 08/22/2007 Document Revised: 07/20/2013 Document Reviewed: 01/13/2013 [employer] Patient Information 2015 [employer]. This information is not intended to replace advice given to you by your health care provider. Make sure you discuss any questions you have with your health care provider.  

## 2014-02-03 NOTE — MAU Provider Note (Signed)
History     CSN: [id number]  Arrival date and time: 02/03/14 4098   First Provider Initiated Contact with Patient 02/03/14 2241      Chief Complaint  Patient presents with  . Vaginal Bleeding  . Abdominal Pain   HPI  [name] is a 38 y.o. G3P0020 at [redacted]w[redacted]d who presents to MAU today with complaint of vaginal bleeding and lower abdominal pain. The patient states that she noted a scant amount of tan discharge yesterday and then pink today on her pad and with wiping. She states mild lower abdominal cramping. She denies vaginal discharge, fever or UTI symptoms. She states that she has been followed by MD in the [place] due to her poor obstetrical history. Patient states SAB as a teenager and SROM at 15 weeks with last pregnancy. She is currently taking progesterone.   OB History    Gravida Para Term Preterm AB TAB SAB Ectopic Multiple Living   3    2  2          Past Medical History  Diagnosis Date  . Frequent headaches     History reviewed. No pertinent past surgical history.  History reviewed. No pertinent family history.  History  Substance Use Topics  . Smoking status: Current Some Day Smoker  . Smokeless tobacco: Not on file  . Alcohol Use: Yes     Comment: ocassionally    Allergies:  Allergies  Allergen Reactions  . Lortab [Hydrocodone-Acetaminophen] Itching    Prescriptions prior to admission  Medication Sig Dispense Refill Last Dose  . Aspirin-Acetaminophen-Caffeine (GOODY HEADACHE PO) Take 1 Package by mouth daily as needed.   Past Month at Unknown time  . prenatal vitamin w/FE, FA (PRENATAL 1 + 1) 27-1 MG TABS tablet Take 1 tablet by mouth daily at 12 noon.   02/03/2014 at Unknown time  . progesterone (PROMETRIUM) 100 MG capsule Take 100 mg by mouth every morning.   02/03/2014 at Unknown time  . progesterone (PROMETRIUM) 200 MG capsule Take 200 mg by mouth at bedtime.   02/02/2014 at Unknown time  . azithromycin (ZITHROMAX Z-PAK) 250 MG tablet 2  po day one, then 1 daily x 4 days (Patient not taking: Reported on 01/27/2014) 5 tablet 0 Unknown at Unknown time  . ibuprofen (ADVIL,MOTRIN) 600 MG tablet Take 1 tablet (600 mg total) by mouth every 6 (six) hours as needed. (Patient not taking: Reported on 01/27/2014) 30 tablet 0 Unknown at Unknown time  . mometasone (NASONEX) 50 MCG/ACT nasal spray Place 2 sprays into the nose daily. (Patient not taking: Reported on 01/27/2014) 17 g 12 Unknown at Unknown time  . naproxen (NAPROSYN) 500 MG tablet Take 1 tablet (500 mg total) by mouth 2 (two) times daily. (Patient not taking: Reported on 01/27/2014) 30 tablet 0 Unknown at Unknown time  . naproxen sodium (ANAPROX) 220 MG tablet Take 220 mg by mouth daily as needed (pain).   More than a month at Unknown time  . oxymetazoline (AFRIN NASAL SPRAY) 0.05 % nasal spray Place 1 spray into both nostrils 2 (two) times daily. (Patient not taking: Reported on 01/27/2014) 30 mL 0 Unknown at Unknown time    Review of Systems  Constitutional: Negative for fever and malaise/fatigue.  Gastrointestinal: Positive for abdominal pain. Negative for nausea and vomiting.  Genitourinary:       + vaginal bleeding Neg - vaginal discharge   Physical Exam   Blood pressure 128/88, pulse 86, temperature 98 F (36.7 C), temperature source  Oral, resp. rate 18, height 5\' 11"  (1.803 m), weight 186 lb (84.369 kg), last menstrual period 12/26/2013, SpO2 99 %.  Physical Exam  Constitutional: She is oriented to person, place, and time. She appears well-developed and well-nourished. No distress.  HENT:  Head: Normocephalic.  Cardiovascular: Normal rate.   Respiratory: Effort normal.  GI: Soft. She exhibits no distension and no mass. There is no tenderness. There is no rebound and no guarding.  Genitourinary: Uterus is enlarged (slightly). Uterus is not tender. Cervix exhibits no motion tenderness, no discharge and no friability. Right adnexum displays no mass and no tenderness.  Left adnexum displays no mass and no tenderness. There is bleeding (very light pink) in the vagina. Vaginal discharge (small amount of thin, white discharge noted) found.  Neurological: She is alert and oriented to person, place, and time.  Skin: Skin is warm and dry. No erythema.  Psychiatric: She has a normal mood and affect.   Results for orders placed or performed during the hospital encounter of 02/03/14 (from the past 24 hour(s))  Urinalysis, Routine w reflex microscopic     Status: Abnormal   Collection Time: 02/03/14  8:10 PM  Result Value Ref Range   Color, Urine YELLOW YELLOW   APPearance CLEAR CLEAR   Specific Gravity, Urine 1.010 1.005 - 1.030   pH 5.5 5.0 - 8.0   Glucose, UA NEGATIVE NEGATIVE mg/dL   Hgb urine dipstick SMALL (A) NEGATIVE   Bilirubin Urine NEGATIVE NEGATIVE   Ketones, ur NEGATIVE NEGATIVE mg/dL   Protein, ur NEGATIVE NEGATIVE mg/dL   Urobilinogen, UA 0.2 0.0 - 1.0 mg/dL   Nitrite NEGATIVE NEGATIVE   Leukocytes, UA NEGATIVE NEGATIVE  Urine microscopic-add on     Status: Abnormal   Collection Time: 02/03/14  8:10 PM  Result Value Ref Range   Squamous Epithelial / LPF FEW (A) RARE   WBC, UA 0-2 <3 WBC/hpf   RBC / HPF 3-6 <3 RBC/hpf   Bacteria, UA FEW (A) RARE  Pregnancy, urine POC     Status: Abnormal   Collection Time: 02/03/14  8:24 PM  Result Value Ref Range   Preg Test, Ur POSITIVE (A) NEGATIVE  CBC with Differential     Status: Abnormal   Collection Time: 02/03/14  8:30 PM  Result Value Ref Range   WBC 10.6 (H) 4.0 - 10.5 K/uL   RBC 4.28 3.87 - 5.11 MIL/uL   Hemoglobin 12.5 12.0 - 15.0 g/dL   HCT 16.137.1 09.636.0 - 04.546.0 %   MCV 86.7 78.0 - 100.0 fL   MCH 29.2 26.0 - 34.0 pg   MCHC 33.7 30.0 - 36.0 g/dL   RDW 40.913.4 81.111.5 - 91.415.5 %   Platelets 244 150 - 400 K/uL   Neutrophils Relative % 47 43 - 77 %   Neutro Abs 5.0 1.7 - 7.7 K/uL   Lymphocytes Relative 42 12 - 46 %   Lymphs Abs 4.4 (H) 0.7 - 4.0 K/uL   Monocytes Relative 7 3 - 12 %   Monocytes  Absolute 0.8 0.1 - 1.0 K/uL   Eosinophils Relative 3 0 - 5 %   Eosinophils Absolute 0.3 0.0 - 0.7 K/uL   Basophils Relative 1 0 - 1 %   Basophils Absolute 0.1 0.0 - 0.1 K/uL  ABO/Rh     Status: None (Preliminary result)   Collection Time: 02/03/14  8:30 PM  Result Value Ref Range   ABO/RH(D) O POS   hCG, quantitative, pregnancy     Status:  Abnormal   Collection Time: 02/03/14  8:30 PM  Result Value Ref Range   hCG, Beta Chain, Quant, S 4489 (H) <5 mIU/mL  Wet prep, genital     Status: None   Collection Time: 02/03/14 10:51 PM  Result Value Ref Range   Yeast Wet Prep HPF POC NONE SEEN NONE SEEN   Trich, Wet Prep NONE SEEN NONE SEEN   Clue Cells Wet Prep HPF POC NONE SEEN NONE SEEN   WBC, Wet Prep HPF POC NONE SEEN NONE SEEN   US Ob Comp Less 14 Wks  02/03/2014   CLINICAL DATA:  Early pregnancy, vaginal bleeding, spotting, and cramping, quantitative beta HCG 4489 ; EGA [redacted] weeks 4 days by LMP of 12/26/2013  EXAM: OBSTETRIC <14 WK [place] AND TRANSVAGINAL OB US  TECHNIQUE: Both transabdominal and transvaginal ultrasound examinations were performed for complete evaluation of the gestation as well as the maternal uterus, adnexal regions, and pelvic cul-de-sac. Transvaginal technique was performed to assess early pregnancy.  COMPARISON:  None  FINDINGS: Intrauterine gestational sac: Visualized/normal in shape.  Yolk sac:  Present  Embryo:  Not identified  Cardiac Activity: N/A  Heart Rate:  N/A bpm  MSD:  8.1  mm   5 w   3  d              [place] EDC: 10/03/2014  Maternal uterus/adnexae:  No subchorionic hemorrhage.  LEFT ovary normal size and morphology, 5.2 x 2.2 x 2.8 cm.  RIGHT ovary normal size and morphology, 4.5 x 2.8 x 2.7 cm.  No adnexal masses or free pelvic fluid.  IMPRESSION: Intrauterine gestational sac containing a yolk sac but no fetal pole identified, unable to establish viability.  No other pelvic abnormality seen.   Electronically Signed   By: Ulyses Southward M.D.   On: 02/03/2014 22:18   US Ob  Transvaginal  02/03/2014   CLINICAL DATA:  Early pregnancy, vaginal bleeding, spotting, and cramping, quantitative beta HCG 4489 ; EGA [redacted] weeks 4 days by LMP of 12/26/2013  EXAM: OBSTETRIC <14 WK [place] AND TRANSVAGINAL OB US  TECHNIQUE: Both transabdominal and transvaginal ultrasound examinations were performed for complete evaluation of the gestation as well as the maternal uterus, adnexal regions, and pelvic cul-de-sac. Transvaginal technique was performed to assess early pregnancy.  COMPARISON:  None  FINDINGS: Intrauterine gestational sac: Visualized/normal in shape.  Yolk sac:  Present  Embryo:  Not identified  Cardiac Activity: N/A  Heart Rate:  N/A bpm  MSD:  8.1  mm   5 w   3  d              [place] EDC: 10/03/2014  Maternal uterus/adnexae:  No subchorionic hemorrhage.  LEFT ovary normal size and morphology, 5.2 x 2.2 x 2.8 cm.  RIGHT ovary normal size and morphology, 4.5 x 2.8 x 2.7 cm.  No adnexal masses or free pelvic fluid.  IMPRESSION: Intrauterine gestational sac containing a yolk sac but no fetal pole identified, unable to establish viability.  No other pelvic abnormality seen.   Electronically Signed   By: Ulyses Southward M.D.   On: 02/03/2014 22:18    MAU Course  Procedures None  MDM +UPT UA, wet prep, GC/Chlamydia, CBC, HIV, RPR, quant hCG and [place] today  Assessment and Plan  A: IUGS and YS at [redacted]w[redacted]d Abdominal pain in pregnancy, antepartum Vaginal bleeding in pregnancy prior to [redacted] weeks gestation Poor obstetrical history   P: Discharge home Bleeding precautions discussed Patient advised to  follow-up with OB provider as scheduled  Patient may return to MAU as needed or if her condition were to change or worsen   Marny LowensteinJulie N Charo Philipp, PA-C  02/03/2014, 11:45 PM

## 2014-02-03 NOTE — MAU Note (Signed)
Pt reports [facility], has history of ROM at 15 weeks with previous preg. Had BHCG done here in town last Thursday that was 412, progesterone level was low and he started her on PO progesterone. Today began having pinkish discharge and some cramping.

## 2014-02-04 LAB — ABO/RH: ABO/RH(D): O POS

## 2014-02-04 LAB — GC/CHLAMYDIA PROBE AMP
CT PROBE, AMP APTIMA: NEGATIVE
GC Probe RNA: NEGATIVE

## 2014-02-04 LAB — RPR

## 2014-02-04 LAB — HIV ANTIBODY (ROUTINE TESTING W REFLEX): HIV 1&2 Ab, 4th Generation: NONREACTIVE

## 2014-03-19 NOTE — L&D Delivery Note (Signed)
Delivery Note Patient labored down for 1.5h.  She was then 10/10/+1.  She pushed well for 25 minutes.  At 10:41 PM a viable female was delivered via Vaginal, Spontaneous Delivery (Presentation: Middle Occiput Anterior).  APGAR: 9, 9; weight  pending.   Placenta status: Intact, Spontaneous.  Cord: 3 vessels with the following complications: None.  Cord pH: n/a   Anesthesia: Epidural  Episiotomy: None Lacerations: ;Periurethral Suture Repair: 3.0 vicryl rapide Est. Blood Loss (mL): 124  Mom to postpartum.  Baby to Couplet care / Skin to Skin.  Alexandra [name] GEFFEL Darien Kading 09/27/2014, 11:29 PM

## 2014-03-23 ENCOUNTER — Inpatient Hospital Stay (HOSPITAL_COMMUNITY)
Admission: AD | Admit: 2014-03-23 | Discharge: 2014-03-23 | Disposition: A | Discharge status: Home / Self Care | Payer: Self-pay | Source: Ambulatory Visit | Attending: Obstetrics & Gynecology | Admitting: Obstetrics & Gynecology

## 2014-03-23 ENCOUNTER — Encounter (HOSPITAL_COMMUNITY): Payer: Self-pay | Admitting: *Deleted

## 2014-03-23 ENCOUNTER — Inpatient Hospital Stay (HOSPITAL_COMMUNITY): Payer: Self-pay

## 2014-03-23 DIAGNOSIS — O99331 Smoking (tobacco) complicating pregnancy, first trimester: Secondary | ICD-10-CM | POA: Insufficient documentation

## 2014-03-23 DIAGNOSIS — O43891 Other placental disorders, first trimester: Secondary | ICD-10-CM

## 2014-03-23 DIAGNOSIS — R109 Unspecified abdominal pain: Secondary | ICD-10-CM

## 2014-03-23 DIAGNOSIS — O208 Other hemorrhage in early pregnancy: Secondary | ICD-10-CM | POA: Insufficient documentation

## 2014-03-23 DIAGNOSIS — Z3A12 12 weeks gestation of pregnancy: Secondary | ICD-10-CM | POA: Insufficient documentation

## 2014-03-23 DIAGNOSIS — R1032 Left lower quadrant pain: Secondary | ICD-10-CM

## 2014-03-23 DIAGNOSIS — O26899 Other specified pregnancy related conditions, unspecified trimester: Secondary | ICD-10-CM

## 2014-03-23 DIAGNOSIS — O9989 Other specified diseases and conditions complicating pregnancy, childbirth and the puerperium: Secondary | ICD-10-CM

## 2014-03-23 LAB — URINALYSIS, ROUTINE W REFLEX MICROSCOPIC
BILIRUBIN URINE: NEGATIVE
Glucose, UA: 250 mg/dL — AB
KETONES UR: NEGATIVE mg/dL
Leukocytes, UA: NEGATIVE
Nitrite: NEGATIVE
Protein, ur: NEGATIVE mg/dL
Specific Gravity, Urine: >1.03 — ABNORMAL HIGH (ref 1.005–1.030)
UROBILINOGEN UA: 0.2 mg/dL (ref 0.0–1.0)
pH: 6 (ref 5.0–8.0)

## 2014-03-23 LAB — URINE MICROSCOPIC-ADD ON

## 2014-03-23 NOTE — MAU Note (Addendum)
LLQ, constant pinching twisting pulling pain .  Was a little nagging last night.  Also has a headache- day 6; hx of migraines- states this is diffferent

## 2014-03-23 NOTE — MAU Note (Signed)
Pt states she is having pain on her right side.Pt states FHR heard in area pt was feeling discomfort

## 2014-03-23 NOTE — Discharge Instructions (Signed)
Your pain was most likely caused by pulling on the round ligaments holding up your uterus. This is normal in pregnancy.   Abdominal Pain During Pregnancy Abdominal pain is common in pregnancy. Most of the time, it does not cause harm. There are many causes of abdominal pain. Some causes are more serious than others. Some of the causes of abdominal pain in pregnancy are easily diagnosed. Occasionally, the diagnosis takes time to understand. Other times, the cause is not determined. Abdominal pain can be a sign that something is very wrong with the pregnancy, or the pain may have nothing to do with the pregnancy at all. For this reason, always tell your health care provider if you have any abdominal discomfort. HOME CARE INSTRUCTIONS  Monitor your abdominal pain for any changes. The following actions may help to alleviate any discomfort you are experiencing:  Do not have sexual intercourse or put anything in your vagina until your symptoms go away completely.  Get plenty of rest until your pain improves.  Drink clear fluids if you feel nauseous. Avoid solid food as long as you are uncomfortable or nauseous.  Only take over-the-counter or prescription medicine as directed by your health care provider.  Keep all follow-up appointments with your health care provider. SEEK IMMEDIATE MEDICAL CARE IF:  You are bleeding, leaking fluid, or passing tissue from the vagina.  You have increasing pain or cramping.  You have persistent vomiting.  You have painful or bloody urination.  You have a fever.  You notice a decrease in your baby's movements.  You have extreme weakness or feel faint.  You have shortness of breath, with or without abdominal pain.  You develop a severe headache with abdominal pain.  You have abnormal vaginal discharge with abdominal pain.  You have persistent diarrhea.  You have abdominal pain that continues even after rest, or gets worse. MAKE SURE YOU:   Understand  these instructions.  Will watch your condition.  Will get help right away if you are not doing well or get worse. Document Released: 03/05/2005 Document Revised: 12/24/2012 Document Reviewed: 10/02/2012 [facility] Patient Information 2015 [place]. This information is not intended to replace advice given to you by your health care provider. Make sure you discuss any questions you have with your health care provider.

## 2014-03-23 NOTE — MAU Note (Signed)
Express concern, prior loss 12-15wks

## 2014-03-23 NOTE — MAU Provider Note (Signed)
Chief Complaint: Abdominal Pain  First Provider Initiated Contact with Patient 03/23/14 1815     SUBJECTIVE HPI: Alexandra [name] is a 39 y.o. G3P0020 at [redacted]w[redacted]d by LMP who presents with LLQ pain since last night. 4/10 on pain scale, twisting, pulling sensation mostly upon standing. Worse after she would sit for long stretches at work today. Getting care at [facility]. [place] and pelvic exam normal. Has spotting early in pregnancy, but none now. No fever, chills, vaginal discharge, urinary complaints, GI complaints. Last BM today. Very nervous because previous SAB were at this gestation.   Past Medical History  Diagnosis Date  . Frequent headaches    OB History  Gravida Para Term Preterm AB SAB TAB Ectopic Multiple Living  # Outcome Date GA Lbr Len/2nd Weight Sex Delivery Anes PTL Lv  3 Current           2 SAB           1 SAB              History reviewed. No pertinent past surgical history. History   Social History  . Marital Status: Single    Spouse Name: N/A    Number of Children: N/A  . Years of Education: N/A   Occupational History  . Not on file.   Social History Main Topics  . Smoking status: Current Some Day Smoker  . Smokeless tobacco: Not on file  . Alcohol Use: Yes     Comment: ocassionally  . Drug Use: No  . Sexual Activity: Yes    Birth Control/ Protection: None   Other Topics Concern  . Not on file   Social History Narrative   No current facility-administered medications on file prior to encounter.   Current Outpatient Prescriptions on File Prior to Encounter  Medication Sig Dispense Refill  . Aspirin-Acetaminophen-Caffeine (GOODY HEADACHE PO) Take 1 Package by mouth daily as needed.    Marland Kitchen azithromycin (ZITHROMAX Z-PAK) 250 MG tablet 2 po day one, then 1 daily x 4 days (Patient not taking: Reported on 01/27/2014) 5 tablet 0  . mometasone (NASONEX) 50 MCG/ACT nasal spray Place 2 sprays into the nose daily. (Patient not taking:  Reported on 01/27/2014) 17 g 12  . oxymetazoline (AFRIN NASAL SPRAY) 0.05 % nasal spray Place 1 spray into both nostrils 2 (two) times daily. (Patient not taking: Reported on 01/27/2014) 30 mL 0  . prenatal vitamin w/FE, FA (PRENATAL 1 + 1) 27-1 MG TABS tablet Take 1 tablet by mouth daily at 12 noon.    . progesterone (PROMETRIUM) 100 MG capsule Take 100 mg by mouth every morning.    . progesterone (PROMETRIUM) 200 MG capsule Take 200 mg by mouth at bedtime.     Allergies  Allergen Reactions  . Lortab [Hydrocodone-Acetaminophen] Itching    ROS: Pertinent pos items in HPI. Neg fever, chills, vaginal discharge, urinary complaints, GI complaints. Last BM today.   OBJECTIVE Blood pressure 143/92, pulse 103, temperature 97.8 F (36.6 C), temperature source Oral, resp. rate 18, last menstrual period 12/26/2013. GENERAL: Well-developed, well-nourished female in no acute distress.  HEENT: Normocephalic HEART: normal rate RESP: normal effort ABDOMEN: Soft, LLQ TTP. Pos BS x 4. No CVAT.  EXTREMITIES: Nontender, no edema NEURO: Alert and oriented SPECULUM EXAM: Declined due to recent exam. FHR 157 by doppler  LAB RESULTS Results for orders placed or performed during the hospital encounter of 03/23/14 (from  the past 24 hour(s))  Urinalysis, Routine w reflex microscopic     Status: Abnormal   Collection Time: 03/23/14  3:35 PM  Result Value Ref Range   Color, Urine YELLOW YELLOW   APPearance HAZY (A) CLEAR   Specific Gravity, Urine >1.030 (H) 1.005 - 1.030   pH 6.0 5.0 - 8.0   Glucose, UA 250 (A) NEGATIVE mg/dL   Hgb urine dipstick TRACE (A) NEGATIVE   Bilirubin Urine NEGATIVE NEGATIVE   Ketones, ur NEGATIVE NEGATIVE mg/dL   Protein, ur NEGATIVE NEGATIVE mg/dL   Urobilinogen, UA 0.2 0.0 - 1.0 mg/dL   Nitrite NEGATIVE NEGATIVE   Leukocytes, UA NEGATIVE NEGATIVE  Urine microscopic-add on     Status: Abnormal   Collection Time: 03/23/14  3:35 PM  Result Value Ref Range   Squamous  Epithelial / LPF FEW (A) RARE   WBC, UA 0-2 <3 WBC/hpf   Bacteria, UA FEW (A) RARE   Crystals CA OXALATE CRYSTALS (A) NEGATIVE    IMAGING [place] Ob Comp Less 14 Wks  03/23/2014   CLINICAL DATA:  Pregnant patient with left lower quadrant pain. Estimated gestational age [redacted] weeks 3 days by last menstrual period.  EXAM: OBSTETRIC <14 WK ULTRASOUND  TECHNIQUE: Transabdominal ultrasound was performed for evaluation of the gestation as well as the maternal uterus and adnexal regions.  COMPARISON:  Obstetric ultrasound 02/03/2014  FINDINGS: Intrauterine gestational sac: Visualized/normal in shape. No definite subchorionic hemorrhage.  Yolk sac:  Not definitively seen, likely due to gestational age.  Embryo:  Present, fetal cardiac motion is seen.  Cardiac Activity: Present.  Heart Rate: 158  bpm  CRL:   6.32  cm   12 w 5 d                  US EDC: 09/30/2014  Maternal uterus/adnexae: Both ovaries are identified and normal. Right ovary measures 3.9 x 1.8 x 2.8 cm. Left ovary measures 5.8 x 1.5 x 2.3 cm. Blood flow seen to both ovaries. No adnexal mass.  IMPRESSION: 1. Single live intrauterine gestation estimated gestational age [redacted] weeks 5 days for estimated date of delivery 09/30/2014. 2. Normal appearance of both ovaries.  No adnexal mass.   Electronically Signed   By: Rubye OaksMelanie  Ehinger M.D.   On: 03/23/2014 19:06    MAU COURSE  ASSESSMENT 1. Subchorionic hematoma, first trimester   2. LLQ pain-likely round ligament pain  3. Abdominal pain affecting pregnancy, antepartum     PLAN Discharge home in stable condition. Increase fluids and rest . Comfort measures.      Follow-up Information    [facility] Sanjuana MaeWALDA STACIA, MD.   Specialty:  Obstetrics and Gynecology   Why:  As scheduled or  As needed if symptoms worsen   Contact information:   [address] [place] [id number] [phone]       [facility] with [facility].   Why:   As needed in emergencies   Contact information:   [address] [id number] [place] [id number] [phone]       Medication List    STOP taking these medications        azithromycin 250 MG tablet  Commonly known as:  ZITHROMAX Z-PAK     GOODY HEADACHE PO      TAKE these medications        mometasone 50 MCG/ACT nasal spray  Commonly known as:  NASONEX  Place  2 sprays into the nose daily.     oxymetazoline 0.05 % nasal spray  Commonly known as:  AFRIN NASAL SPRAY  Place 1 spray into both nostrils 2 (two) times daily.     prenatal vitamin w/FE, FA 27-1 MG Tabs tablet  Take 1 tablet by mouth daily at 12 noon.     progesterone 100 MG capsule  Commonly known as:  PROMETRIUM  Take 100 mg by mouth every morning.     progesterone 200 MG capsule  Commonly known as:  PROMETRIUM  Take 200 mg by mouth at bedtime.       [place], [place] 03/23/2014  7:17 PM

## 2014-03-31 LAB — OB RESULTS CONSOLE ABO/RH: RH Type: POSITIVE

## 2014-03-31 LAB — OB RESULTS CONSOLE HIV ANTIBODY (ROUTINE TESTING): HIV: NONREACTIVE

## 2014-03-31 LAB — OB RESULTS CONSOLE RPR: RPR: NONREACTIVE

## 2014-03-31 LAB — OB RESULTS CONSOLE HEPATITIS B SURFACE ANTIGEN: HEP B S AG: NEGATIVE

## 2014-03-31 LAB — OB RESULTS CONSOLE ANTIBODY SCREEN: ANTIBODY SCREEN: NEGATIVE

## 2014-03-31 LAB — OB RESULTS CONSOLE RUBELLA ANTIBODY, IGM: Rubella: IMMUNE

## 2014-04-03 ENCOUNTER — Inpatient Hospital Stay (HOSPITAL_COMMUNITY)
Admission: AD | Admit: 2014-04-03 | Discharge: 2014-04-04 | Disposition: A | Discharge status: Home / Self Care | Payer: 59 | Source: Ambulatory Visit | Attending: Obstetrics | Admitting: Obstetrics

## 2014-04-03 ENCOUNTER — Encounter (HOSPITAL_COMMUNITY): Payer: Self-pay

## 2014-04-03 DIAGNOSIS — O9989 Other specified diseases and conditions complicating pregnancy, childbirth and the puerperium: Secondary | ICD-10-CM | POA: Diagnosis not present

## 2014-04-03 DIAGNOSIS — Z87891 Personal history of nicotine dependence: Secondary | ICD-10-CM | POA: Diagnosis not present

## 2014-04-03 DIAGNOSIS — Z3490 Encounter for supervision of normal pregnancy, unspecified, unspecified trimester: Secondary | ICD-10-CM

## 2014-04-03 DIAGNOSIS — G43009 Migraine without aura, not intractable, without status migrainosus: Secondary | ICD-10-CM | POA: Diagnosis present

## 2014-04-03 DIAGNOSIS — Z3A14 14 weeks gestation of pregnancy: Secondary | ICD-10-CM | POA: Insufficient documentation

## 2014-04-03 HISTORY — DX: Polycystic ovarian syndrome: E28.2

## 2014-04-03 HISTORY — DX: Anemia, unspecified: D64.9

## 2014-04-03 LAB — URINE MICROSCOPIC-ADD ON

## 2014-04-03 LAB — URINALYSIS, ROUTINE W REFLEX MICROSCOPIC
BILIRUBIN URINE: NEGATIVE
Glucose, UA: NEGATIVE mg/dL
Ketones, ur: NEGATIVE mg/dL
LEUKOCYTES UA: NEGATIVE
Nitrite: NEGATIVE
PH: 6 (ref 5.0–8.0)
Protein, ur: NEGATIVE mg/dL
Specific Gravity, Urine: 1.025 (ref 1.005–1.030)
UROBILINOGEN UA: 0.2 mg/dL (ref 0.0–1.0)

## 2014-04-03 NOTE — MAU Note (Signed)
Been having headaches.  Has tried Tylenol and caffeine, not working.  Tried hot shower, didn't work either.  Feels like sharp pains shooting through head.  No vaginal bleeding.  No abd pain.

## 2014-04-04 DIAGNOSIS — Z3490 Encounter for supervision of normal pregnancy, unspecified, unspecified trimester: Secondary | ICD-10-CM

## 2014-04-04 MED ORDER — PROMETHAZINE HCL 25 MG PO TABS: 25.0000 mg | ORAL_TABLET | Freq: Four times a day (QID) | ORAL | Status: DC | PRN

## 2014-04-04 MED ORDER — BUTALBITAL-APAP-CAFFEINE 50-325-40 MG PO TABS: 1.0000 | ORAL_TABLET | Freq: Four times a day (QID) | ORAL | Status: DC | PRN

## 2014-04-04 MED ORDER — PROMETHAZINE HCL 25 MG PO TABS
25.0000 mg | ORAL_TABLET | Freq: Once | ORAL | Status: AC
  Administered 2014-04-04: 25 mg via ORAL
  Filled 2014-04-04: qty 1

## 2014-04-04 MED ORDER — BUTALBITAL-APAP-CAFFEINE 50-325-40 MG PO TABS
2.0000 | ORAL_TABLET | Freq: Once | ORAL | Status: AC
Start: 2014-04-04 — End: 2014-04-04
  Administered 2014-04-04: 1 via ORAL
  Filled 2014-04-04: qty 1
  Filled 2014-04-04: qty 2

## 2014-04-04 NOTE — MAU Provider Note (Signed)
History     CSN: [id number]  Arrival date and time: 04/03/14 2248   First Provider Initiated Contact with Patient 04/04/14 0016      Chief Complaint  Patient presents with  . Headache   HPI Comments: Alexandra [name] 39 y.o. [id number] presents to MAU with migraine without aura. She has a known history of migraine and when she is not pregnant she takes Zomig. She has had a recent severe withdrawal from caffeine. She was drinking 24 pepsi per day and abruptly stopped. She has taken tylenol with no help. She denies any issues with pregnancy. She has also stopped smoking.   Headache  Associated symptoms include photophobia.      Past Medical History  Diagnosis Date  . Frequent headaches   . Anemia   . PCOS (polycystic ovarian syndrome)     Past Surgical History  Procedure Laterality Date  . Dilation and curettage of uterus      Family History  Problem Relation Age of Onset  . Hypertension Mother   . Heart disease Mother   . Hyperlipidemia Mother   . Heart disease Father   . Cancer Father   . Hypertension Father   . Cancer Sister   . Diabetes Maternal Uncle   . Diabetes Paternal Aunt     History  Substance Use Topics  . Smoking status: Former Games developermoker  . Smokeless tobacco: Never Used  . Alcohol Use: Yes     Comment: ocassionally- before preg    Allergies:  Allergies  Allergen Reactions  . Lortab [Hydrocodone-Acetaminophen] Itching    Prescriptions prior to admission  Medication Sig Dispense Refill Last Dose  . acetaminophen (TYLENOL) 500 MG tablet Take 500 mg by mouth every 6 (six) hours as needed.   04/03/2014 at Unknown time  . OVER THE COUNTER MEDICATION Gas X   04/03/2014 at Unknown time  . prenatal vitamin w/FE, FA (PRENATAL 1 + 1) 27-1 MG TABS tablet Take 1 tablet by mouth daily at 12 noon.   04/02/2014 at Unknown time  . mometasone (NASONEX) 50 MCG/ACT nasal spray Place 2 sprays into the nose daily. (Patient not taking: Reported on 01/27/2014) 17 g  12 Unknown at Unknown time  . oxymetazoline (AFRIN NASAL SPRAY) 0.05 % nasal spray Place 1 spray into both nostrils 2 (two) times daily. (Patient not taking: Reported on 01/27/2014) 30 mL 0 Unknown at Unknown time  . progesterone (PROMETRIUM) 100 MG capsule Take 100 mg by mouth every morning.   02/03/2014 at Unknown time  . progesterone (PROMETRIUM) 200 MG capsule Take 200 mg by mouth at bedtime.   02/02/2014 at Unknown time    Review of Systems  Constitutional: Negative.   Eyes: Positive for photophobia.  Respiratory: Negative.   Cardiovascular: Negative.   Gastrointestinal: Negative.   Genitourinary: Negative.   Musculoskeletal: Negative.   Neurological: Negative.   Psychiatric/Behavioral: Negative.    Physical Exam   Blood pressure 127/84, pulse 92, temperature 98.3 F (36.8 C), temperature source Oral, resp. rate 16, last menstrual period 12/26/2013, SpO2 100 %.  Physical Exam  Constitutional: She is oriented to person, place, and time. She appears well-developed and well-nourished. She appears distressed.  Seems uncomfortable  HENT:  Head: Normocephalic and atraumatic.  Very photophobic  Eyes: Conjunctivae are normal. Pupils are equal, round, and reactive to light.  Cardiovascular: Normal rate, regular rhythm and normal heart sounds.   Respiratory: Effort normal and breath sounds normal. No respiratory distress. She has no wheezes.  GI: Soft.  Bowel sounds are normal. She exhibits no distension. There is no tenderness. There is no rebound.  Musculoskeletal: Normal range of motion.  Neurological: She is alert and oriented to person, place, and time.  Skin: Skin is warm and dry.  Psychiatric: She has a normal mood and affect. Her behavior is normal. Judgment and thought content normal.    MAU Course  Procedures  MDM Will give Fioricet and phenergan and ice pack/ doing much better Advised Imitrex is category C in pregnancy and often used   Spoke with Dr Chestine Spore who was ok  with sending her home  Assessment and Plan   A: Migraine without aura/ likely related to severe caffeine withdrawal    P: Fioricet and phenergan for home Discussed nonmedication management of migraines Advised Imitrex can be used in pregnancy verses excessive pain medications Follow up with Dr Chestine Spore as needed [name] 04/04/2014, 12:34 AM

## 2014-04-04 NOTE — Discharge Instructions (Signed)
Migraine Headache A migraine headache is very bad, throbbing pain on one or both sides of your head. Talk to your doctor about what things may bring on (trigger) your migraine headaches. HOME CARE  Only take medicines as told by your doctor.  Lie down in a dark, quiet room when you have a migraine.  Keep a journal to find out if certain things bring on migraine headaches. For example, write down:  What you eat and drink.  How much sleep you get.  Any change to your diet or medicines.  Lessen how much alcohol you drink.  Quit smoking if you smoke.  Get enough sleep.  Lessen any stress in your life.  Keep lights dim if bright lights bother you or make your migraines worse. GET HELP RIGHT AWAY IF:   Your migraine becomes really bad.  You have a fever.  You have a stiff neck.  You have trouble seeing.  Your muscles are weak, or you lose muscle control.  You lose your balance or have trouble walking.  You feel like you will pass out (faint), or you pass out.  You have really bad symptoms that are different than your first symptoms. MAKE SURE YOU:   Understand these instructions.  Will watch your condition.  Will get help right away if you are not doing well or get worse. Document Released: 12/13/2007 Document Revised: 05/28/2011 Document Reviewed: 11/10/2012 [facility] Patient Information 2015 [place], [place]. This information is not intended to replace advice given to you by your health care provider. Make sure you discuss any questions you have with your health care provider.  Recurrent Migraine Headache A migraine headache is very bad, throbbing pain on one or both sides of your head. Recurrent migraines keep coming back. Talk to your doctor about what things may bring on (trigger) your migraine headaches. HOME CARE  Only take medicines as told by your doctor.  Lie down in a dark, quiet room when you have a migraine.  Keep a journal to find out if certain  things bring on migraine headaches. For example, write down:  What you eat and drink.  How much sleep you get.  Any change to your diet or medicines.  Lessen how much alcohol you drink.  Quit smoking if you smoke.  Get enough sleep.  Lessen any stress in your life.  Keep lights dim if bright lights bother you or make your migraines worse. GET HELP IF:  Medicine does not help your migraines.  Your pain keeps coming back.  You have a fever. GET HELP RIGHT AWAY IF:   Your migraine becomes really bad.  You have a stiff neck.  You have trouble seeing.  Your muscles are weak, or you lose muscle control.  You lose your balance or have trouble walking.  You feel like you will pass out (faint), or you pass out.  You have really bad symptoms that are different than your first symptoms. MAKE SURE YOU:   Understand these instructions.  Will watch your condition.  Will get help right away if you are not doing well or get worse. Document Released: 12/13/2007 Document Revised: 03/10/2013 Document Reviewed: 11/10/2012 [facility] Patient Information 2015 [place], [place]. This information is not intended to replace advice given to you by your health care provider. Make sure you discuss any questions you have with your health care provider.  Recurrent Migraine Headache A migraine headache is very bad, throbbing pain on one or both sides of your head. Recurrent migraines  keep coming back. Talk to your doctor about what things may bring on (trigger) your migraine headaches. HOME CARE  Only take medicines as told by your doctor.  Lie down in a dark, quiet room when you have a migraine.  Keep a journal to find out if certain things bring on migraine headaches. For example, write down:  What you eat and drink.  How much sleep you get.  Any change to your diet or medicines.  Lessen how much alcohol you drink.  Quit smoking if you smoke.  Get enough sleep.  Lessen any  stress in your life.  Keep lights dim if bright lights bother you or make your migraines worse. GET HELP IF:  Medicine does not help your migraines.  Your pain keeps coming back.  You have a fever. GET HELP RIGHT AWAY IF:   Your migraine becomes really bad.  You have a stiff neck.  You have trouble seeing.  Your muscles are weak, or you lose muscle control.  You lose your balance or have trouble walking.  You feel like you will pass out (faint), or you pass out.  You have really bad symptoms that are different than your first symptoms. MAKE SURE YOU:   Understand these instructions.  Will watch your condition.  Will get help right away if you are not doing well or get worse. Document Released: 12/13/2007 Document Revised: 03/10/2013 Document Reviewed: 11/10/2012 [facility] Patient Information 2015 [place]. This information is not intended to replace advice given to you by your health care provider. Make sure you discuss any questions you have with your health care provider.

## 2014-04-04 NOTE — Progress Notes (Signed)
Written and verbal d/c instructions given and understanding voiced. 

## 2014-04-13 ENCOUNTER — Inpatient Hospital Stay (HOSPITAL_COMMUNITY)
Admission: AD | Admit: 2014-04-13 | Discharge: 2014-04-13 | Disposition: A | Discharge status: Home / Self Care | Payer: 59 | Source: Ambulatory Visit | Attending: Obstetrics | Admitting: Obstetrics

## 2014-04-13 ENCOUNTER — Inpatient Hospital Stay (HOSPITAL_COMMUNITY): Payer: 59

## 2014-04-13 ENCOUNTER — Encounter (HOSPITAL_COMMUNITY): Payer: Self-pay | Admitting: General Practice

## 2014-04-13 DIAGNOSIS — R42 Dizziness and giddiness: Secondary | ICD-10-CM | POA: Diagnosis not present

## 2014-04-13 DIAGNOSIS — R200 Anesthesia of skin: Secondary | ICD-10-CM

## 2014-04-13 DIAGNOSIS — F419 Anxiety disorder, unspecified: Secondary | ICD-10-CM

## 2014-04-13 DIAGNOSIS — O09292 Supervision of pregnancy with other poor reproductive or obstetric history, second trimester: Secondary | ICD-10-CM | POA: Insufficient documentation

## 2014-04-13 DIAGNOSIS — G43709 Chronic migraine without aura, not intractable, without status migrainosus: Secondary | ICD-10-CM

## 2014-04-13 DIAGNOSIS — O9989 Other specified diseases and conditions complicating pregnancy, childbirth and the puerperium: Secondary | ICD-10-CM | POA: Diagnosis not present

## 2014-04-13 DIAGNOSIS — R1030 Lower abdominal pain, unspecified: Secondary | ICD-10-CM | POA: Insufficient documentation

## 2014-04-13 DIAGNOSIS — O99342 Other mental disorders complicating pregnancy, second trimester: Secondary | ICD-10-CM

## 2014-04-13 DIAGNOSIS — Z3A15 15 weeks gestation of pregnancy: Secondary | ICD-10-CM | POA: Diagnosis not present

## 2014-04-13 DIAGNOSIS — R202 Paresthesia of skin: Secondary | ICD-10-CM

## 2014-04-13 DIAGNOSIS — R51 Headache: Secondary | ICD-10-CM | POA: Insufficient documentation

## 2014-04-13 LAB — URINALYSIS, ROUTINE W REFLEX MICROSCOPIC
Bilirubin Urine: NEGATIVE
GLUCOSE, UA: NEGATIVE mg/dL
Hgb urine dipstick: NEGATIVE
KETONES UR: NEGATIVE mg/dL
Leukocytes, UA: NEGATIVE
NITRITE: NEGATIVE
PH: 6 (ref 5.0–8.0)
PROTEIN: NEGATIVE mg/dL
SPECIFIC GRAVITY, URINE: 1.01 (ref 1.005–1.030)
Urobilinogen, UA: 0.2 mg/dL (ref 0.0–1.0)

## 2014-04-13 LAB — COMPREHENSIVE METABOLIC PANEL WITH GFR
ALT: 39 U/L — ABNORMAL HIGH (ref 0–35)
AST: 44 U/L — ABNORMAL HIGH (ref 0–37)
Albumin: 3.2 g/dL — ABNORMAL LOW (ref 3.5–5.2)
Alkaline Phosphatase: 53 U/L (ref 39–117)
Anion gap: 7 (ref 5–15)
BUN: 10 mg/dL (ref 6–23)
CO2: 21 mmol/L (ref 19–32)
Calcium: 9.1 mg/dL (ref 8.4–10.5)
Chloride: 105 mmol/L (ref 96–112)
Creatinine, Ser: 0.49 mg/dL — ABNORMAL LOW (ref 0.50–1.10)
GFR calc Af Amer: >90 mL/min
GFR calc non Af Amer: >90 mL/min
Glucose, Bld: 80 mg/dL (ref 70–99)
Potassium: 4 mmol/L (ref 3.5–5.1)
Sodium: 133 mmol/L — ABNORMAL LOW (ref 135–145)
Total Bilirubin: 0.2 mg/dL — ABNORMAL LOW (ref 0.3–1.2)
Total Protein: 7.2 g/dL (ref 6.0–8.3)

## 2014-04-13 LAB — CBC
HCT: 34.5 % — ABNORMAL LOW (ref 36.0–46.0)
Hemoglobin: 11.5 g/dL — ABNORMAL LOW (ref 12.0–15.0)
MCH: 28.5 pg (ref 26.0–34.0)
MCHC: 33.3 g/dL (ref 30.0–36.0)
MCV: 85.4 fL (ref 78.0–100.0)
Platelets: 235 10*3/uL (ref 150–400)
RBC: 4.04 MIL/uL (ref 3.87–5.11)
RDW: 13.7 % (ref 11.5–15.5)
WBC: 11.8 10*3/uL — ABNORMAL HIGH (ref 4.0–10.5)

## 2014-04-13 NOTE — MAU Note (Signed)
Pt also states that she has lower left side back pain that makes her legs go numb. Pt states that she has fallen out at home due to the dizziness. She states that she is nervous to leave her house she thinks due to her last fetal loss at 15 week

## 2014-04-13 NOTE — Discharge Instructions (Signed)
Second Trimester of Pregnancy The second trimester is from week 13 through week 28, month 4 through 6. This is often the time in pregnancy that you feel your best. Often times, morning sickness has lessened or quit. You may have more energy, and you may get hungry more often. Your unborn baby (fetus) is growing rapidly. At the end of the sixth month, he or she is about 9 inches long and weighs about 1 pounds. You will likely feel the baby move (quickening) between 18 and 20 weeks of pregnancy. HOME CARE   Avoid all smoking, herbs, and alcohol. Avoid drugs not approved by your doctor.  Only take medicine as told by your doctor. Some medicines are safe and some are not during pregnancy.  Exercise only as told by your doctor. Stop exercising if you start having cramps.  Eat regular, healthy meals.  Wear a good support bra if your breasts are tender.  Do not use hot tubs, steam rooms, or saunas.  Wear your seat belt when driving.  Avoid raw meat, uncooked cheese, and liter boxes and soil used by cats.  Take your prenatal vitamins.  Try taking medicine that helps you poop (stool softener) as needed, and if your doctor approves. Eat more fiber by eating fresh fruit, vegetables, and whole grains. Drink enough fluids to keep your pee (urine) clear or pale yellow.  Take warm water baths (sitz baths) to soothe pain or discomfort caused by hemorrhoids. Use hemorrhoid cream if your doctor approves.  If you have puffy, bulging veins (varicose veins), wear support hose. Raise (elevate) your feet for 15 minutes, 3-4 times a day. Limit salt in your diet.  Avoid heavy lifting, wear low heals, and sit up straight.  Rest with your legs raised if you have leg cramps or low back pain.  Visit your dentist if you have not gone during your pregnancy. Use a soft toothbrush to brush your teeth. Be gentle when you floss.  You can have sex (intercourse) unless your doctor tells you not to.  Go to your  doctor visits. GET HELP IF:   You feel dizzy.  You have mild cramps or pressure in your lower belly (abdomen).  You have a nagging pain in your belly area.  You continue to feel sick to your stomach (nauseous), throw up (vomit), or have watery poop (diarrhea).  You have bad smelling fluid coming from your vagina.  You have pain with peeing (urination). GET HELP RIGHT AWAY IF:   You have a fever.  You are leaking fluid from your vagina.  You have spotting or bleeding from your vagina.  You have severe belly cramping or pain.  You lose or gain weight rapidly.  You have trouble catching your breath and have chest pain.  You notice sudden or extreme puffiness (swelling) of your face, hands, ankles, feet, or legs.  You have not felt the baby move in over an hour.  You have severe headaches that do not go away with medicine.  You have vision changes. Document Released: 05/30/2009 Document Revised: 06/30/2012 Document Reviewed: 05/06/2012 [facility] Patient Information 2015 [place], [place]. This information is not intended to replace advice given to you by your health care provider. Make sure you discuss any questions you have with your health care provider.   Generalized Anxiety Disorder Generalized anxiety disorder (GAD) is a mental disorder. It interferes with life functions, including relationships, work, and school. GAD is different from normal anxiety, which everyone experiences at some point in  their lives in response to specific life events and activities. Normal anxiety actually helps us prepare for and get through these life events and activities. Normal anxiety goes away after the event or activity is over.  GAD causes anxiety that is not necessarily related to specific events or activities. It also causes excess anxiety in proportion to specific events or activities. The anxiety associated with GAD is also difficult to control. GAD can vary from mild to severe. People  with severe GAD can have intense waves of anxiety with physical symptoms (panic attacks).  SYMPTOMS The anxiety and worry associated with GAD are difficult to control. This anxiety and worry are related to many life events and activities and also occur more days than not for 6 months or longer. People with GAD also have three or more of the following symptoms (one or more in children):  Restlessness.   Fatigue.  Difficulty concentrating.   Irritability.  Muscle tension.  Difficulty sleeping or unsatisfying sleep. DIAGNOSIS GAD is diagnosed through an assessment by your health care provider. Your health care provider will ask you questions aboutyour mood,physical symptoms, and events in your life. Your health care provider may ask you about your medical history and use of alcohol or drugs, including prescription medicines. Your health care provider may also do a physical exam and blood tests. Certain medical conditions and the use of certain substances can cause symptoms similar to those associated with GAD. Your health care provider may refer you to a mental health specialist for further evaluation. TREATMENT The following therapies are usually used to treat GAD:   Medication. Antidepressant medication usually is prescribed for long-term daily control. Antianxiety medicines may be added in severe cases, especially when panic attacks occur.   Talk therapy (psychotherapy). Certain types of talk therapy can be helpful in treating GAD by providing support, education, and guidance. A form of talk therapy called cognitive behavioral therapy can teach you healthy ways to think about and react to daily life events and activities.  Stress managementtechniques. These include yoga, meditation, and exercise and can be very helpful when they are practiced regularly. A mental health specialist can help determine which treatment is best for you. Some people see improvement with one therapy. However,  other people require a combination of therapies. Document Released: 06/30/2012 Document Revised: 07/20/2013 Document Reviewed: 06/30/2012 [facility] Patient Information 2015 [place], [place]. This information is not intended to replace advice given to you by your health care provider. Make sure you discuss any questions you have with your health care provider.

## 2014-04-13 NOTE — MAU Note (Signed)
Pt presents to MAU with complaints of headaches, dizziness, and lower abdominal pain. Pt was given fiorecet for headache and phenergan but was told not to take it anymore by a nurse at the office. Pt states that she lost her last baby at 15 weeks and thinks that is playing a big part in her headaches being afraid of losing her baby since she is now [redacted] weeks pregnant

## 2014-04-13 NOTE — MAU Provider Note (Signed)
Chief Complaint: Headache and Dizziness   First Provider Initiated Contact with Patient 04/13/14 [phone]      SUBJECTIVE HPI: Alexandra [name] is a 39 y.o. G3P0020 at [redacted]w[redacted]d by LMP who presents with multiple concerns over the past ~week: 1. Intermittent left low back pain that sometimes wraps around to left low abd and causes her left leg to go numb. 2. Intermittent left arm numbness usually not coinciding w/ left leg symptoms. 3. One episode of dizziness yesterday causing her to fall. Was doing dishes at the time. Denies LOC or abd trauma 4. Fear that she will lose her baby because she PPPROM'd at 15 weeks w her last pregnancy in 2012. Fears make it almost impossible to leave her home or sleep. Has dreams that the baby has died or has cancer. 5. Few episodes of racing heartbeat accompanied by SOB. One of these episodes preceded fall yesterday. No Hx similar episodes before this past week.  6. Migraine headache. Long history of migraines. States this feels the same as the others. Used Zomig with good results prior to pregnancy but stopped due to pregnancy. Was given Fioricet at previous MAU visit on January 17. States worked very well, but at her next prenatal visit she was told not to use it. States they told her to use ibuprofen, but she is worried that it may cause birth defects or she hasn't taken anything for this migraine.    Pt's main concern seems to be that she is the same gestation as her previous pregnancy loss. Wants to be checked for broken water.  Getting prenatal care at [facility]. Hasn't discussed these concerns at prenatal visits.  Past Medical History  Diagnosis Date  . Frequent headaches   . Anemia   . PCOS (polycystic ovarian syndrome)    OB History  Gravida Para Term Preterm AB SAB TAB Ectopic Multiple Living  # Outcome Date GA Lbr Len/2nd Weight Sex Delivery Anes PTL Lv  3 Current           2 SAB 2012 [redacted]w[redacted]d            Complications: Preterm  premature rupture of membranes (PPROM) with onset of labor after 24 hours of rupture in second trimester, antepartum  1 SAB 1994             Past Surgical History  Procedure Laterality Date  . Dilation and curettage of uterus     History   Social History  . Marital Status: Single    Spouse Name: N/A    Number of Children: N/A  . Years of Education: N/A   Occupational History  . Not on file.   Social History Main Topics  . Smoking status: Former Games developer  . Smokeless tobacco: Never Used  . Alcohol Use: Yes     Comment: ocassionally- before preg  . Drug Use: No  . Sexual Activity: Yes    Birth Control/ Protection: None   Other Topics Concern  . Not on file   Social History Narrative   No current facility-administered medications on file prior to encounter.   Current Outpatient Prescriptions on File Prior to Encounter  Medication Sig Dispense Refill  . butalbital-acetaminophen-caffeine (FIORICET) 50-325-40 MG per tablet Take 1-2 tablets by mouth every 6 (six) hours as needed for headache. 20 tablet 0  . OVER THE COUNTER MEDICATION Take 1 tablet by mouth 3 (three) times daily before meals.  Gas X    . prenatal vitamin w/FE, FA (PRENATAL 1 + 1) 27-1 MG TABS tablet Take 1 tablet by mouth daily at 12 noon.    . promethazine (PHENERGAN) 25 MG tablet Take 1 tablet (25 mg total) by mouth every 6 (six) hours as needed for nausea or vomiting. 30 tablet 0  . mometasone (NASONEX) 50 MCG/ACT nasal spray Place 2 sprays into the nose daily. (Patient not taking: Reported on 04/13/2014) 17 g 12  . oxymetazoline (AFRIN NASAL SPRAY) 0.05 % nasal spray Place 1 spray into both nostrils 2 (two) times daily. (Patient not taking: Reported on 04/13/2014) 30 mL 0   Allergies  Allergen Reactions  . Lortab [Hydrocodone-Acetaminophen] Itching    ROS: Pertinent items in HPI. Negative for fever, chills, vaginal bleeding, vaginal discharge, loss of consciousness, difficulties with speech or gait, urinary  complaints or GI complaints.  OBJECTIVE Blood pressure 130/89, pulse 99, temperature 97.9 F (36.6 C), resp. rate 18, last menstrual period 12/26/2013, SpO2 99 %. GENERAL: Well-developed, well-nourished female in no acute distress. Very anxious. Not diaphoretic. HEENT: Normocephalic. Normal speech. No facial droop. Facial movements symmetric. Sinuses nontender. HEART: normal rate and rhythm. No murmurs rubs or gallops. RESP: normal effort. Auscultation bilaterally. ABDOMEN: Soft, non-tender. Gravid, size equals dates. Positive bowel sounds 4.  BACK: Nontender. Normal range of motion. Negative CVA tenderness. EXTREMITIES: Nontender, no edema. Normal and equal strength bilaterally in upper and lower extremities.   NEURO: Alert and oriented. States sensation is the same on bilateral lower extremities but examiner's hand feels warmer on the left arm than right arm. SPECULUM EXAM: NEFG, physiologic discharge, negative pooling, no blood noted, cervix clean BIMANUAL: cervix closed and long; uterus 16-week size, no adnexal tenderness or masses FHR 144 by doppler.   LAB RESULTS Results for orders placed or performed during the hospital encounter of 04/13/14 (from the past 24 hour(s))  Urinalysis, Routine w reflex microscopic     Status: None   Collection Time: 04/13/14  9:10 AM  Result Value Ref Range   Color, Urine YELLOW YELLOW   APPearance CLEAR CLEAR   Specific Gravity, Urine 1.010 1.005 - 1.030   pH 6.0 5.0 - 8.0   Glucose, UA NEGATIVE NEGATIVE mg/dL   Hgb urine dipstick NEGATIVE NEGATIVE   Bilirubin Urine NEGATIVE NEGATIVE   Ketones, ur NEGATIVE NEGATIVE mg/dL   Protein, ur NEGATIVE NEGATIVE mg/dL   Urobilinogen, UA 0.2 0.0 - 1.0 mg/dL   Nitrite NEGATIVE NEGATIVE   Leukocytes, UA NEGATIVE NEGATIVE  CBC     Status: Abnormal   Collection Time: 04/13/14  9:43 AM  Result Value Ref Range   WBC 11.8 (H) 4.0 - 10.5 K/uL   RBC 4.04 3.87 - 5.11 MIL/uL   Hemoglobin 11.5 (L) 12.0 - 15.0  g/dL   HCT 16.134.5 (L) 09.636.0 - 04.546.0 %   MCV 85.4 78.0 - 100.0 fL   MCH 28.5 26.0 - 34.0 pg   MCHC 33.3 30.0 - 36.0 g/dL   RDW 40.913.7 81.111.5 - 91.415.5 %   Platelets 235 150 - 400 K/uL  Comprehensive metabolic panel     Status: Abnormal   Collection Time: 04/13/14  9:43 AM  Result Value Ref Range   Sodium 133 (L) 135 - 145 mmol/L   Potassium 4.0 3.5 - 5.1 mmol/L   Chloride 105 96 - 112 mmol/L   CO2 21 19 - 32 mmol/L   Glucose, Bld 80 70 - 99 mg/dL   BUN 10 6 - 23 mg/dL  Creatinine, Ser 0.49 (L) 0.50 - 1.10 mg/dL   Calcium 9.1 8.4 - 16.1 mg/dL   Total Protein 7.2 6.0 - 8.3 g/dL   Albumin 3.2 (L) 3.5 - 5.2 g/dL   AST 44 (H) 0 - 37 U/L   ALT 39 (H) 0 - 35 U/L   Alkaline Phosphatase 53 39 - 117 U/L   Total Bilirubin 0.2 (L) 0.3 - 1.2 mg/dL   GFR calc non Af Amer >90 >90 mL/min   GFR calc Af Amer >90 >90 mL/min   Anion gap 7 5 - 15    IMAGING Cervical length 3.64 cm.  MAU COURSE CBC, CMP, UA, OB Limited for cervical length.  Discussed patient's symptoms, history with Dr. Henderson Cloud. EKG ordered. Does not want to give any headache medications today due to concern that patient would attribute any pregnancy complications to that medicine. Agrees with remaining plan of care.  EKG reviewed by cardiologist. Normal for pregnancy.  ASSESSMENT 1. Chronic migraine without aura without status migrainosus, not intractable   2. 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 Numbness and tingling in left arm   4. Anxiety during pregnancy in second trimester, antepartum    PLAN Discharge homeIn stable condition per consult with Dr. Henderson Cloud. Extremely lengthy discussion with patient that most symptoms are related to anxiety from reaching the same point in the pregnancy as she was with her previous pregnancy loss. Reassured her that wire has not broken now and cervix is long and closed. Discussed relaxation techniques and comfort measures.  Recommend increasing fluids and  avoiding migraine triggers for now until she can have further discussion with her obstetrician about migraine management in pregnancy.  Very low suspicion for symptoms being related to stroke or emergent cardiac condition, but red flags reviewed.  Follow-up Information    Follow up with Marlow Baars, MD In 1 week.   Specialty:  Obstetrics   Why:  As scheduled or sooner As needed if symptoms worsen   Contact information:   [address] [place] [address] [phone]       Follow up with [facility] [place] [facility]ERN[facility] ADMISSIONS.   Why:  As needed in emergencies   Contact information:   [address] [id number] mc [place] [address] [phone]       Medication List    STOP taking these medications        butalbital-acetaminophen-caffeine 50-325-40 MG per tablet  Commonly known as:  FIORICET      TAKE these medications        mometasone 50 MCG/ACT nasal spray  Commonly known as:  NASONEX  Place 2 sprays into the nose daily.     OVER THE COUNTER MEDICATION  Take 1 tablet by mouth 3 (three) times daily before meals. Gas X     oxymetazoline 0.05 % nasal spray  Commonly known as:  AFRIN NASAL SPRAY  Place 1 spray into both nostrils 2 (two) times daily.     prenatal vitamin w/FE, FA 27-1 MG Tabs tablet  Take 1 tablet by mouth daily at 12 noon.     promethazine 25 MG tablet  Commonly known as:  PHENERGAN  Take 1 tablet (25 mg total) by mouth every 6 (six) hours as needed for nausea or vomiting.       [place], CNM 04/13/2014  2:06 PM

## 2014-04-27 ENCOUNTER — Telehealth: Payer: Self-pay | Admitting: *Deleted

## 2014-04-27 ENCOUNTER — Encounter: Payer: Self-pay | Admitting: Neurology

## 2014-04-27 ENCOUNTER — Ambulatory Visit (INDEPENDENT_AMBULATORY_CARE_PROVIDER_SITE_OTHER): Payer: 59 | Admitting: Neurology

## 2014-04-27 VITALS — BP 128/83 | HR 116 | Ht 71.0 in | Wt 208.0 lb

## 2014-04-27 DIAGNOSIS — H539 Unspecified visual disturbance: Secondary | ICD-10-CM | POA: Diagnosis not present

## 2014-04-27 DIAGNOSIS — E538 Deficiency of other specified B group vitamins: Secondary | ICD-10-CM | POA: Diagnosis not present

## 2014-04-27 DIAGNOSIS — G43009 Migraine without aura, not intractable, without status migrainosus: Secondary | ICD-10-CM

## 2014-04-27 DIAGNOSIS — R51 Headache: Secondary | ICD-10-CM | POA: Diagnosis not present

## 2014-04-27 DIAGNOSIS — G43909 Migraine, unspecified, not intractable, without status migrainosus: Secondary | ICD-10-CM | POA: Insufficient documentation

## 2014-04-27 DIAGNOSIS — R519 Headache, unspecified: Secondary | ICD-10-CM

## 2014-04-27 MED ORDER — VERAPAMIL HCL 120 MG PO TABS: 120.0000 mg | ORAL_TABLET | Freq: Three times a day (TID) | ORAL | Status: DC

## 2014-04-27 MED ORDER — VERAPAMIL HCL ER 120 MG PO CP24: 120.0000 mg | ORAL_CAPSULE | Freq: Every day | ORAL | Status: DC

## 2014-04-27 NOTE — Telephone Encounter (Signed)
A request fax over to [facility] on 04/27/14 requesting records.

## 2014-04-27 NOTE — Patient Instructions (Addendum)
Overall you are doing fairly well but I do want to suggest a few things today:   Remember to drink plenty of fluid, eat healthy meals and do not skip any meals. Try to eat protein with a every meal and eat a healthy snack such as fruit or nuts in between meals. Try to keep a regular sleep-wake schedule and try to exercise daily, particularly in the form of walking, 20-30 minutes a day, if you can.   As far as your medications are concerned, I would like to suggest: Verapamil once daily. Please discuss with your OBGyn before starting  As far as diagnostic testing: MRI of the brain  I would like to see you back in 4-8 weeks, sooner if we need to. Please call us with any interim questions, concerns, problems, updates or refill requests.   Please also call us for any test results so we can go over those with you on the phone.  My clinical assistant and will answer any of your questions and relay your messages to me and also relay most of my messages to you.   Our phone number is [phone]. We also have an after hours call service for urgent matters and there is a physician on-call for urgent questions. For any emergencies you know to call 911 or go to the nearest emergency room  There are limited treatments in pregnancy and all have risks.  Tylenol is generally accepted as safe in pregnancy although Verapamil is pregnany class C and cannot rule out risk. Discussed at length with patient.  Also recommend an MRI of the brain as patient has not had brain imaging and is having vision changes. Also recommend trying: Cool Compress. Lie down and place a cool compress on your head.  Avoid headache triggers. If certain foods or odors seem to have triggered your migraines in the past, avoid them. A headache diary might help you identify triggers.  Include physical activity in your daily routine. Try a daily walk or other moderate aerobic exercise.  Manage stress. Find healthy ways to cope with the  stressors, such as delegating tasks on your to-do list.  Practice relaxation techniques. Try deep breathing, yoga, massage and visualization.  Eat regularly. Eating regularly scheduled meals and maintaining a healthy diet might help prevent headaches. Also, drink plenty of fluids.  Follow a regular sleep schedule. Sleep deprivation might contribute to headaches  Consider biofeedback. With this mind-body technique, you learn to control certain bodily functions - such as muscle tension, heart rate and blood pressure - to prevent headaches or reduce headache pain.  Headaches during pregnancy are common. However, if you develop a severe headache or a headache that doesn't go away, call your health care provider. Severe headaches can be a sign of a pregnancy complication

## 2014-04-27 NOTE — Progress Notes (Addendum)
[facility]    Provider:  Dr Alexandra [name] Referring Provider: Marlow Baars, MD Primary Care Physician:  No PCP Per Patient  CC:  Migraines  HPI:  Alexandra [name] is a 39 y.o. female here as a referral from Dr. Chestine [name] for migraines.  She has migraines for more than 14 years, was following with [facility]. Prior to that she was on Zomig nasal spray. She has tried Topamax in the past, she doesn't really know what medications as there were so many. She is in her second trimester. She was recently given Fioricet and phenergan. She was placed on Flexeril but it didn't help. She has tried Tylenol which doesn't work. Migraines are mostly left sided, she is having left-sided numbness with the headaches, she is having worsening headaches every day. Going into a dark room helps. They are pulsating, pain before the left eye, light sensitivity, sound sensitivity, perfume and smells trigger her headaches. Mother has migraines. Father has migraines. Brother has headaches. They last all day, tylenol may help a little but she needs a dark room otherwise she can have 10/10 pain for 10 hours straight, she has lost vision 3 times in the last 2 weeks, her right eye got so blurry she couldn't see out of it for 3 minutes. Her blood pressure has been elevated. No aura.   Reviewed notes, labs and imaging from outside physicians, which showed: Patient was seen in urgent care for migraine on 1/17, recent severe withdrawal from caffeine after drinking 24 pepsi per day and then stopping.She was given fioricet and phenergan likely related to severe caffeine withdrawal. She was seen again at the end of January for headaches and dizziness, she has severe anxiety dur to previous premature rupture of membranes at 15 weeks in her last pregnancy, was reassured that she was fine and sent home. EKG showed NSR. CMP on 1/26 showed sodium of 133, AST and ALT slightly elevated. RPR and HIV 02/03/14  neg.  Review of Systems: Patient complains of symptoms per HPI as well as the following symptoms: fatigue, blurred vision, loss of vision, palpitations, SOB, increased thirst, cramps. Pertinent negatives per HPI. All others negative.   History   Social History  . Marital Status: Single    Spouse Name: N/A    Number of Children: N/A  . Years of Education: N/A   Occupational History  . [employer] Other   Social History Main Topics  . Smoking status: Former Smoker    Quit date: 01/17/2014  . Smokeless tobacco: Former Neurosurgeon    Quit date: 01/17/2014  . Alcohol Use: No     Comment: ocassionally- 2 per year  . Drug Use: No  . Sexual Activity: Yes    Birth Control/ Protection: None   Other Topics Concern  . 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   Has a bachelor's degree.   Caffeine use: 2 cups of pepsi/day     Family History  Problem Relation Age of Onset  . Hypertension Mother   . Heart disease Mother   . Hyperlipidemia Mother   . Heart disease Father   . Stomach cancer Father        . Hypertension Father   . Cancer Sister   . Diabetes Maternal Uncle   . Diabetes Paternal Aunt   . Thyroid disease Other   . Migraines Brother   . Migraines Mother     Past Medical History  Diagnosis Date  .  Frequent headaches   . Anemia   . PCOS (polycystic ovarian syndrome)   . Depression     Past Surgical History  Procedure Laterality Date  . Dilation and curettage of uterus    . Colposcopy      Current Outpatient Prescriptions  Medication Sig Dispense Refill  . cyclobenzaprine (FLEXERIL) 10 MG tablet Take 10 mg by mouth 3 (three) times daily as needed for muscle spasms.    . Hydroxyprogesterone Caproate (MAKENA IM) Inject 1 Dose into the muscle.    Marland Kitchen. OVER THE COUNTER MEDICATION Take 1 tablet by mouth 3 (three) times daily before meals. Gas X    . prenatal vitamin w/FE, FA (PRENATAL 1 + 1) 27-1 MG TABS tablet Take 1 tablet by mouth daily at 12  noon.    . promethazine (PHENERGAN) 25 MG tablet Take 1 tablet (25 mg total) by mouth every 6 (six) hours as needed for nausea or vomiting. 30 tablet 0  . mometasone (NASONEX) 50 MCG/ACT nasal spray Place 2 sprays into the nose daily. (Patient not taking: Reported on 04/13/2014) 17 g 12  . oxymetazoline (AFRIN NASAL SPRAY) 0.05 % nasal spray Place 1 spray into both nostrils 2 (two) times daily. (Patient not taking: Reported on 04/13/2014) 30 mL 0  . verapamil (VERELAN PM) 120 MG 24 hr capsule Take 1 capsule (120 mg total) by mouth at bedtime. 30 capsule 3   No current facility-administered medications for this visit.    Allergies as of 04/27/2014 - Review Complete 04/13/2014  Allergen Reaction Noted  . Lortab [hydrocodone-acetaminophen] Itching 03/03/2013    Vitals: BP 128/83 mmHg  Pulse 116  Ht 5\' 11"  (1.803 m)  Wt 208 lb (94.348 kg)  BMI 29.02 kg/m2  LMP 12/26/2013 Last Weight:  Wt Readings from Last 1 Encounters:  04/27/14 208 lb (94.348 kg)   Last Height:   Ht Readings from Last 1 Encounters:  04/27/14 5\' 11"  (1.803 m)    Physical exam: Exam: Gen: NAD, conversant, pregnant                  CV: RRR, no MRG. No Carotid Bruits. No peripheral edema, warm, nontender Eyes: Conjunctivae clear without exudates or hemorrhage  Neuro: Detailed Neurologic Exam  Speech:    Speech is normal; fluent and spontaneous with normal comprehension.  Cognition:    The patient is oriented to person, place, and time;     recent and remote memory intact;     language fluent;     normal attention, concentration,     fund of knowledge Cranial Nerves:    The pupils are equal, round, and reactive to light. The fundi are normal and spontaneous venous pulsations are present. Visual fields are full to finger confrontation. Extraocular movements are intact. Trigeminal sensation is intact and the muscles of mastication are normal. The face is symmetric. The palate elevates in the midline. Hearing  intact. Voice is normal. Shoulder shrug is normal. The tongue has normal motion without fasciculations.   Coordination:    Normal finger to nose and heel to shin.  Gait:  normal native gait  Motor Observation:    No asymmetry, no atrophy, and no involuntary movements noted. Tone:    Normal muscle tone.    Posture:    Posture is normal.     Strength:    Strength is V/V in the upper and lower limbs.      Sensation: intact to LT     Reflex Exam:  DTR's:  Deep tendon reflexes in the upper and lower extremities are normal bilaterally.   Toes:    The toes are equivocal bilaterally.   Clonus:    Clonus is absent.      Assessment/Plan:  39 year old female in her second trimester with chronic migraines without aura. Neuro exam is non focal. Worsening headaches with temporary monocular vision loss, should have an MRI of the brain. Suspect there is an aspect of severe anxiety due to previous pregnancy complication.    Suggest Verapamil but want her to discuss with OBGyn tomorrow and have OB approve as well.  Also discussed Sphenocath procedure with patient and asked her to think about trying this for migraine management. If successful, could help minimize  medication use during pregnancy. [facility] Want to follow patient closely, 4-6 weeks  There are limited treatments in pregnancy and all have risks.  Tylenol is generally accepted as safe in pregnancy although Verapamil is pregnany class C and cannot rule out risk. Discussed at length with patient.  Also recommend an MRI of the brain as patient has not had brain imaging and is having vision changes. Also recommend trying: Cool Compress. Lie down and place a cool compress on your head.  Avoid headache triggers. If certain foods or odors seem to have triggered your migraines in the past, avoid them. A headache diary might help you identify triggers.  Include physical activity in your daily  routine. Try a daily walk or other moderate aerobic exercise.  Manage stress. Find healthy ways to cope with the stressors, such as delegating tasks on your to-do list.  Practice relaxation techniques. Try deep breathing, yoga, massage and visualization.  Eat regularly. Eating regularly scheduled meals and maintaining a healthy diet might help prevent headaches. Also, drink plenty of fluids.  Follow a regular sleep schedule. Sleep deprivation might contribute to headaches  Consider biofeedback. With this mind-body technique, you learn to control certain bodily functions - such as muscle tension, heart rate and blood pressure - to prevent headaches or reduce headache pain.  Headaches during pregnancy are common. However, if you develop a severe headache or a headache that doesn't go away, call your health care provider. Severe headaches can be a sign of a pregnancy complication   Naomie [name], MD  [facility] [address] [place], [place] [address]  Phone [phone] Fax [phone]

## 2014-04-27 NOTE — Addendum Note (Signed)
Addended by: Naomie DeanAHERN, ANTONIA B on: 04/27/2014 08:19 PM   Modules accepted: Level of Service

## 2014-04-30 ENCOUNTER — Telehealth: Payer: Self-pay | Admitting: Neurology

## 2014-04-30 NOTE — Telephone Encounter (Signed)
Patient stated she's experiencing cold sweats, diarrahea, and worsening migraines after taking Rx verapamil (VERELAN PM) 120 MG 24 hr capsule.  Questioning if she needs to discontinue taking meds or to continue.  Please call and advise.

## 2014-05-01 NOTE — Telephone Encounter (Signed)
Spoke to patient. She will come in for Sphenocath. Appointment provided.

## 2014-05-06 ENCOUNTER — Ambulatory Visit: Payer: 59 | Admitting: Neurology

## 2014-05-06 ENCOUNTER — Ambulatory Visit (INDEPENDENT_AMBULATORY_CARE_PROVIDER_SITE_OTHER): Payer: 59

## 2014-05-06 DIAGNOSIS — R51 Headache: Secondary | ICD-10-CM

## 2014-05-06 DIAGNOSIS — H539 Unspecified visual disturbance: Secondary | ICD-10-CM

## 2014-05-06 DIAGNOSIS — R519 Headache, unspecified: Secondary | ICD-10-CM

## 2014-05-07 ENCOUNTER — Encounter: Payer: Self-pay | Admitting: Neurology

## 2014-05-10 ENCOUNTER — Telehealth: Payer: Self-pay | Admitting: Neurology

## 2014-05-10 ENCOUNTER — Telehealth: Payer: Self-pay | Admitting: *Deleted

## 2014-05-10 NOTE — Telephone Encounter (Signed)
Called to discuss MRI findings. Received VM, left message to call us back. Can't leave details, the greeting doesn't specify whose voicemail it is. Thank you.

## 2014-05-10 NOTE — Telephone Encounter (Signed)
Notes from [facility] receive on 05/10/14.

## 2014-05-11 NOTE — Telephone Encounter (Signed)
Patient returning Dr. Trevor MaceAhern's call regarding MRI results.  Please return call to cell # [phone].

## 2014-05-12 NOTE — Telephone Encounter (Signed)
Called patient on my way home from work. Answering machine again. Asked patient to schedule a follow up appointment to review imaging and next steps. Will send a letter as well.

## 2014-05-12 NOTE — Telephone Encounter (Signed)
Patient is returning a call regarding MRI results. Patient can be reached at [phone]. Thank you.

## 2014-05-12 NOTE — Telephone Encounter (Signed)
Called, she was not home. Left message. Will try on my way home from work. Thanks.

## 2014-05-12 NOTE — Telephone Encounter (Signed)
Pt is returning Dr. Trevor MaceAhern's call rearding MRI results she states this is the second time she has left a message.  Please call cell [phone].

## 2014-05-17 ENCOUNTER — Telehealth: Payer: Self-pay | Admitting: Neurology

## 2014-05-17 ENCOUNTER — Encounter: Payer: Self-pay | Admitting: *Deleted

## 2014-05-17 NOTE — Telephone Encounter (Signed)
Pt is calling stating that her headaches are getting worse and would like for a nurse to give her a call back and soon as possible.  Please call and advise.

## 2014-05-17 NOTE — Telephone Encounter (Signed)
I spoke with patient. Would you put her on my schedule for 11:30am tomorrow morning? Not sure if I have a slot, we may need to ask Carollee HerterShannon to create one thank you.

## 2014-05-17 NOTE — Telephone Encounter (Signed)
I set up appointment for 11:30 am tomorrow 3.1.16.

## 2014-05-18 ENCOUNTER — Ambulatory Visit (INDEPENDENT_AMBULATORY_CARE_PROVIDER_SITE_OTHER): Payer: 59 | Admitting: Neurology

## 2014-05-18 ENCOUNTER — Encounter: Payer: Self-pay | Admitting: Neurology

## 2014-05-18 VITALS — BP 123/74 | HR 94 | Ht 71.0 in | Wt 209.0 lb

## 2014-05-18 DIAGNOSIS — M5410 Radiculopathy, site unspecified: Secondary | ICD-10-CM

## 2014-05-18 DIAGNOSIS — G43709 Chronic migraine without aura, not intractable, without status migrainosus: Secondary | ICD-10-CM

## 2014-05-18 DIAGNOSIS — R51 Headache: Secondary | ICD-10-CM | POA: Diagnosis not present

## 2014-05-18 DIAGNOSIS — G43009 Migraine without aura, not intractable, without status migrainosus: Secondary | ICD-10-CM | POA: Diagnosis not present

## 2014-05-18 NOTE — Progress Notes (Signed)
[facility]  Provider: Dr Lucia Gaskins Referring Provider: Marlow Baars, MD Primary Care Physician: No PCP Per Patient  CC: Migraines  HPI: Alexandra [name] is a 39 y.o. female here as a referral from Dr. Chestine Spore for migraines.  She has migraines for more than 14 years, was following with [facility]. Prior to that she was on Zomig nasal spray. She has tried Topamax in the past, she doesn't really know what medications as there were so many. She is in her second trimester. She was recently given Fioricet and phenergan not working. She was placed on Flexeril but it didn't help. She has tried Tylenol which doesn't work. Verapamil didn't work either and she is scared to take medication while pregnant. Migraines are mostly left sided, she is having left-sided numbness with the headaches, she is having worsening headaches every day. Going into a dark room helps. They are pulsating, pain before the left eye, light sensitivity, sound sensitivity, perfume and smells trigger her headaches. Mother has migraines. Father has migraines. Brother has headaches. They last all day, tylenol may help a little but she needs a dark room otherwise she can have 10/10 pain for 10 hours straight. No aura.  Will do Sphenocath today, consented patient appropriately. See procedure note below.  Reviewed notes, labs and imaging from outside physicians, which showed: Patient was seen in urgent care for migraines multiple times recently.  She has severe anxiety dur to previous premature rupture of membranes at 15 weeks in her last pregnancy, was reassured that she was fine and sent home. EKG showed NSR. CMP on 1/26 showed sodium of 133, AST and ALT slightly elevated. RPR and HIV 02/03/14 neg. Abnormal MRI of the brain without contrast showing about 30 small round hyperintensity foci on FLAIR images predominantly in the subcortical white matter of the frontal lobes. This is a nonspecific finding.  These foci could be idiopathic or due to small chronic ischemic change from migraine headache, prior trauma or infection, cardio emboli, vasculitis or small vessel ischemic disease. Demyelination would be less likely to have this pattern.      History   Social History  . Marital Status: Single    Spouse Name: N/A  . Number of Children: N/A  . Years of Education: N/A   Occupational History  . [employer] Other   Social History Main Topics  . Smoking status: Former Smoker    Quit date: 01/17/2014  . Smokeless tobacco: Former Neurosurgeon    Quit date: 01/17/2014  . Alcohol Use: No     Comment: ocassionally- 2 per year  . Drug Use: No  . Sexual Activity: Yes    Birth Control/ Protection: None   Other Topics Concern  . 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   Has a bachelor's degree.   Caffeine use: 2 cups of pepsi/day     Family History  Problem Relation Age of Onset  . Hypertension Mother   . Heart disease Mother   . Hyperlipidemia Mother   . Heart disease Father   . Stomach cancer Father        . Hypertension Father   . Cancer Sister   . Diabetes Maternal Uncle   . Diabetes Paternal Aunt   . Thyroid disease Other   . Migraines Brother   . Migraines Mother     Past Medical History  Diagnosis Date  . Frequent headaches   . Anemia   . PCOS (  polycystic ovarian syndrome)   . Depression     Past Surgical History  Procedure Laterality Date  . Dilation and curettage of uterus    . Colposcopy      Current Outpatient Prescriptions  Medication Sig Dispense Refill  . cyclobenzaprine (FLEXERIL) 10 MG tablet Take 10 mg by mouth 3 (three) times daily as needed for muscle spasms.    . Hydroxyprogesterone Caproate (MAKENA IM) Inject 1 Dose into the muscle.    Marland Kitchen. OVER THE COUNTER MEDICATION Take 1 tablet by mouth 3 (three) times daily before meals. Gas X    . prenatal vitamin w/FE, FA (PRENATAL 1 + 1) 27-1 MG TABS tablet Take 1 tablet by mouth daily  at 12 noon.    . promethazine (PHENERGAN) 25 MG tablet Take 1 tablet (25 mg total) by mouth every 6 (six) hours as needed for nausea or vomiting. 30 tablet 0  . mometasone (NASONEX) 50 MCG/ACT nasal spray Place 2 sprays into the nose daily. (Patient not taking: Reported on 04/13/2014) 17 g 12  . oxymetazoline (AFRIN NASAL SPRAY) 0.05 % nasal spray Place 1 spray into both nostrils 2 (two) times daily. (Patient not taking: Reported on 04/13/2014) 30 mL 0  . verapamil (VERELAN PM) 120 MG 24 hr capsule Take 1 capsule (120 mg total) by mouth at bedtime. (Patient not taking: Reported on 05/18/2014) 30 capsule 3   No current facility-administered medications for this visit.    Allergies as of 05/18/2014 - Review Complete 05/18/2014  Allergen Reaction Noted  . Lortab [hydrocodone-acetaminophen] Itching 03/03/2013    Vitals: BP 123/74 mmHg  Pulse 94  Ht 5\' 11"  (1.803 m)  Wt 209 lb (94.802 kg)  BMI 29.16 kg/m2  LMP 12/26/2013 Last Weight:  Wt Readings from Last 1 Encounters:  05/18/14 209 lb (94.802 kg)   Last Height:   Ht Readings from Last 1 Encounters:  05/18/14 5\' 11"  (1.803 m)      [facility]   Procedure: The patient was placed in the supine position. A temperature strip was added to the cheek area after the area was cleaned with alcohol. The Sphenocath was lubricated with gel, and placed in the right naris. The catheter was inserted above the middle turbinate to the posterior nasal cavity, and then withdrawn 1 cm. The catheter was deployed and rotated approximately 20 towards the nose. 2-1/2 mL of 2% lidocaine was deployed. The patient was asked to swallow during the injection. The patient demonstrated erythema of the sclera of the eye on this side, and an increase in the cheek temperature was noted from 94 F to 98 F.  This process was repeated on the left side, with similar results. The increase in cheek temperature was documented from 2292 F to 3096 F.  The patient  tolerated the procedure well. No complications of the procedure were noted. The patient was kept in the supine position for 8 minutes following the procedure. She was given small sips of water after sitting up following the procedure.  Lidocaine 2% NDC [id number]  Expiration date: 01/2018 Lot number:  [id number]    Naomie DeanAntonia Ahern, MD  [facility] [address] [place], [place] [id number]  Phone [phone] Fax [phone]

## 2014-05-18 NOTE — Patient Instructions (Signed)
.  Overall you are doing fairly well but I do want to suggest a few things today:   Remember to drink plenty of fluid, eat healthy meals and do not skip any meals. Try to eat protein with a every meal and eat a healthy snack such as fruit or nuts in between meals. Try to keep a regular sleep-wake schedule and try to exercise daily, particularly in the form of walking, 20-30 minutes a day, if you can.   I would like to see you back in 1 week for repeat sphenocath, sooner if we need to. Please call us with any interim questions, concerns, problems, updates or refill requests.   Please also call us for any test results so we can go over those with you on the phone.  My clinical assistant and will answer any of your questions and relay your messages to me and also relay most of my messages to you.   Our phone number is [phone]. We also have an after hours call service for urgent matters and there is a physician on-call for urgent questions. For any emergencies you know to call 911 or go to the nearest emergency room

## 2014-05-18 NOTE — Telephone Encounter (Signed)
See office visit dated today. Thank you

## 2014-05-19 ENCOUNTER — Ambulatory Visit: Payer: 59 | Admitting: Neurology

## 2014-05-20 ENCOUNTER — Telehealth: Payer: Self-pay | Admitting: Neurology

## 2014-05-20 NOTE — Telephone Encounter (Signed)
Germaine PomfretMary Jane, RN with [employer] @ [phone] working on medical review, would like to clarify patients condition and treatment plan.  Questioning if patient's taking out of work what would be the time frame.  Also requesting OV notes faxed to [phone].  Please call and advise.

## 2014-05-20 NOTE — Telephone Encounter (Signed)
Patient did not provide us any permission to speak with this representative of provide any information. I let Ms.Germaine PomfretMary Jane know, thanks

## 2014-05-25 ENCOUNTER — Telehealth: Payer: Self-pay | Admitting: Neurology

## 2014-05-25 ENCOUNTER — Other Ambulatory Visit: Payer: Self-pay | Admitting: Neurology

## 2014-05-25 ENCOUNTER — Telehealth: Payer: Self-pay | Admitting: *Deleted

## 2014-05-25 NOTE — Telephone Encounter (Signed)
Pt is calling stating she still has a migraine and the treatment she had only worked for a day, now she is having nose bleeds and her right nostril is stopped up.  Please call and advise.

## 2014-05-25 NOTE — Telephone Encounter (Signed)
Talked with patient about referral to neuro rehab and gave her the number to call them and follow up with them. I also scheduled her for an appointment for tomorrow morning at 8 am on 05/26/14. Told pt to arrive 15 min early and pt verbalized understanding.

## 2014-05-25 NOTE — Telephone Encounter (Signed)
Nosebleed started yesterday. Congestion started the day after the procedure it is green and thick. The migraine was better for 24 hours an dnow it is back.   Kara Meadmma can you get her in to see me tomorrow? thanks

## 2014-05-26 ENCOUNTER — Telehealth: Payer: Self-pay | Admitting: *Deleted

## 2014-05-26 ENCOUNTER — Ambulatory Visit (INDEPENDENT_AMBULATORY_CARE_PROVIDER_SITE_OTHER): Payer: 59 | Admitting: Neurology

## 2014-05-26 ENCOUNTER — Encounter: Payer: Self-pay | Admitting: Neurology

## 2014-05-26 VITALS — BP 126/78 | HR 99 | Ht 71.0 in | Wt 212.0 lb

## 2014-05-26 DIAGNOSIS — G441 Vascular headache, not elsewhere classified: Secondary | ICD-10-CM | POA: Diagnosis not present

## 2014-05-26 MED ORDER — LABETALOL HCL 100 MG PO TABS: 100.0000 mg | ORAL_TABLET | Freq: Two times a day (BID) | ORAL | Status: DC

## 2014-05-26 MED ORDER — MOMETASONE FUROATE 50 MCG/ACT NA SUSP: 2.0000 | Freq: Every day | NASAL | Status: DC

## 2014-05-26 NOTE — Patient Instructions (Signed)
Labetalol 100mg  twice daily Try trick to stay sleeping on your side Nasonex 2 sprays in affected nostrils daily Please review with your OBGYn before starting any medication

## 2014-05-26 NOTE — Progress Notes (Signed)
[facility]    Provider:  Dr Lucia Gaskins Referring Provider: No ref. provider found Primary Care Physician:  No PCP Per Patient  CC: Migraines  HPI: Alexandra [name] is a 39 y.o. female here as a referral from Dr. Chestine Spore for migraines.   05/26/2014: The right nostril feels clogged. A little congested. Clear and funny. The headaches persist. They are hitting her 3-4x a week. They are getting wrose at night than during the day. She is up every 30 minutes with a headache. She is snoring very loud. She sounds like a "loud motor car" every night. She sleeps on her side, or at least tried sometimes she finds her.   MRI of the brain normal   05/18/2014: She has migraines for more than 14 years, was following with [facility]. Prior to that she was on Zomig nasal spray. She has tried Topamax in the past, she doesn't really know what medications as there were so many. She is in her second trimester. She was recently given Fioricet and phenergan not working. She was placed on Flexeril but it didn't help. She has tried Tylenol which doesn't work. Verapamil didn't work either and she is scared to take medication while pregnant. Migraines are mostly left sided, she is having left-sided numbness with the headaches, she is having worsening headaches every day. Going into a dark room helps. They are pulsating, pain before the left eye, light sensitivity, sound sensitivity, perfume and smells trigger her headaches. Mother has migraines. Father has migraines. Brother has headaches. They last all day, tylenol may help a little but she needs a dark room otherwise she can have 10/10 pain for 10 hours straight. No aura. Will do Sphenocath today, consented patient appropriately. See procedure note below.  Reviewed notes, labs and imaging from outside physicians, which showed: Patient was seen in urgent care for migraines multiple times recently. She has severe anxiety dur to previous  premature rupture of membranes at 15 weeks in her last pregnancy, was reassured that she was fine and sent home. EKG showed NSR. CMP on 1/26 showed sodium of 133, AST and ALT slightly elevated. RPR and HIV 02/03/14 neg. Abnormal MRI of the brain without contrast showing about 30 small round hyperintensity foci on FLAIR images predominantly in the subcortical white matter of the frontal lobes. This is a nonspecific finding. These foci could be idiopathic or due to small chronic ischemic change from migraine headache, prior trauma or infection, cardio emboli, vasculitis or small vessel ischemic disease. Demyelination would be less likely to have this pattern.   Review of Systems: Patient complains of symptoms per HPI as well as the following symptoms: No CP, No SOB, No Fevers, No Chills. Pertinent negatives per HPI. All others negative.   History   Social History  . Marital Status: Single    Spouse Name: N/A  . Number of Children: N/A  . Years of Education: N/A   Occupational History  . [employer] Other   Social History Main Topics  . Smoking status: Former Smoker    Quit date: 01/17/2014  . Smokeless tobacco: Former Neurosurgeon    Quit date: 01/17/2014  . Alcohol Use: No     Comment: ocassionally- 2 per year  . Drug Use: No  . Sexual Activity: Yes    Birth Control/ Protection: None   Other Topics Concern  . 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   Has a bachelor's degree.  Caffeine use: 2 cups of pepsi/day     Family History  Problem Relation Age of Onset  . Hypertension Mother   . Heart disease Mother   . Hyperlipidemia Mother   . Heart disease Father   . Stomach cancer Father        . Hypertension Father   . Cancer Sister   . Diabetes Maternal Uncle   . Diabetes Paternal Aunt   . Thyroid disease Other   . Migraines Brother   . Migraines Mother     Past Medical History  Diagnosis Date  . Frequent headaches   . Anemia   . PCOS (polycystic  ovarian syndrome)   . Depression     Past Surgical History  Procedure Laterality Date  . Dilation and curettage of uterus    . Colposcopy      Current Outpatient Prescriptions  Medication Sig Dispense Refill  . cyclobenzaprine (FLEXERIL) 10 MG tablet Take 10 mg by mouth 3 (three) times daily as needed for muscle spasms.    . Hydroxyprogesterone Caproate (MAKENA IM) Inject 1 Dose into the muscle.    Marland Kitchen OVER THE COUNTER MEDICATION Take 1 tablet by mouth 3 (three) times daily before meals. Gas X    . prenatal vitamin w/FE, FA (PRENATAL 1 + 1) 27-1 MG TABS tablet Take 1 tablet by mouth daily at 12 noon.    . promethazine (PHENERGAN) 25 MG tablet Take 1 tablet (25 mg total) by mouth every 6 (six) hours as needed for nausea or vomiting. 30 tablet 0  . labetalol (NORMODYNE) 100 MG tablet Take 1 tablet (100 mg total) by mouth 2 (two) times daily. 60 tablet 3  . mometasone (NASONEX) 50 MCG/ACT nasal spray Place 2 sprays into the nose daily. 17 g 12  . oxymetazoline (AFRIN NASAL SPRAY) 0.05 % nasal spray Place 1 spray into both nostrils 2 (two) times daily. (Patient not taking: Reported on 04/13/2014) 30 mL 0  . verapamil (VERELAN PM) 120 MG 24 hr capsule Take 1 capsule (120 mg total) by mouth at bedtime. (Patient not taking: Reported on 05/18/2014) 30 capsule 3   No current facility-administered medications for this visit.    Allergies as of 05/26/2014 - Review Complete 05/26/2014  Allergen Reaction Noted  . Lortab [hydrocodone-acetaminophen] Itching 03/03/2013    Vitals: BP 126/78 mmHg  Pulse 99  Ht  (1.803 m)  Wt 212 lb (96.163 kg)  BMI 29.58 kg/m2  LMP 12/26/2013 Last Weight:  Wt Readings from Last 1 Encounters:  05/26/14 212 lb (96.163 kg)   Last Height:   Ht Readings from Last 1 Encounters:  05/26/14  (1.803 m)   Neuro: Detailed Neurologic Exam  Speech:  Speech is normal; fluent and spontaneous with normal comprehension.  Cognition:  The patient is oriented  to person, place, and time;   recent and remote memory intact;   language fluent;   normal attention, concentration,   fund of knowledge Cranial Nerves:  The pupils are equal, round, and reactive to light. The fundi are normal and spontaneous venous pulsations are present. Visual fields are full to finger confrontation. Extraocular movements are intact. Trigeminal sensation is intact and the muscles of mastication are normal. The face is symmetric. The palate elevates in the midline. Hearing intact. Voice is normal. Shoulder shrug is normal. The tongue has normal motion without fasciculations.   Gait: normal native gait  Motor Observation:  No asymmetry, no atrophy, and no involuntary movements noted. Tone:  Normal muscle  tone.   Posture:  Posture is normal.    Strength:  Strength is V/V in the upper and lower limbs.   Turbinates edematous on the right, no purulence noted, no blood, no significant abnormalities    Assessment/Plan:  39 year old female in her second trimester with chronic migraines without aura. Neuro exam is non focal. Worsening headaches with temporary monocular vision loss, workup has been negative. Suspect there is an aspect of severe anxiety due to previous pregnancy complication.    Recommend Labetalol daily for migraine prevention. Class B, cannot rule out fetal risks.  Flonase for the sinuses, likely due to allergies Make sure it is ok with OBGYN tomorrow before starting flonase or labetalol. She would like short term disability, psychologist put due date of July 16th.   Alexandra DeanAntonia Alano Blasco, MD  [facility] [address] [place], [place] [id number]  Phone [phone] Fax [phone]  A total of 30 minutes was spent face-to-face with this patient. Over half this time was spent on counseling patient on the migraine and congestions diagnosis and different diagnostic and therapeutic options available.

## 2014-05-26 NOTE — Telephone Encounter (Signed)
Patient paid for her form the form on [place] desk.

## 2014-05-27 DIAGNOSIS — Z0289 Encounter for other administrative examinations: Secondary | ICD-10-CM

## 2014-06-01 ENCOUNTER — Ambulatory Visit: Payer: 59 | Attending: Neurology | Admitting: Physical Therapy

## 2014-06-01 DIAGNOSIS — M6281 Muscle weakness (generalized): Secondary | ICD-10-CM | POA: Insufficient documentation

## 2014-06-01 DIAGNOSIS — M5410 Radiculopathy, site unspecified: Secondary | ICD-10-CM | POA: Diagnosis not present

## 2014-06-01 NOTE — Patient Instructions (Signed)
Discussed trial of standing extension against counter for safety.  Walking program with fiance present.  Sidelying left 5-10 min sessions during the day as opposed to sitting to watch TV.  Patient asks about ideas to decrease LE symptoms while toileting, discussed sitting on toilet backwards (facing the tank) to extend spine vs. Flexion; Also she asks about getting in/out of the bathtub, advised against this secondary to LE weakness and risk involved getting in/out.  Discussed using a bath bench with a mobile shower head.
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
## 2014-06-02 ENCOUNTER — Encounter (HOSPITAL_COMMUNITY): Payer: Self-pay | Admitting: *Deleted

## 2014-06-02 ENCOUNTER — Inpatient Hospital Stay (HOSPITAL_COMMUNITY)
Admission: AD | Admit: 2014-06-02 | Discharge: 2014-06-02 | Disposition: A | Discharge status: Home / Self Care | Payer: 59 | Source: Ambulatory Visit | Attending: Obstetrics and Gynecology | Admitting: Obstetrics and Gynecology

## 2014-06-02 DIAGNOSIS — Z87891 Personal history of nicotine dependence: Secondary | ICD-10-CM | POA: Diagnosis not present

## 2014-06-02 DIAGNOSIS — O9989 Other specified diseases and conditions complicating pregnancy, childbirth and the puerperium: Secondary | ICD-10-CM | POA: Diagnosis not present

## 2014-06-02 DIAGNOSIS — R109 Unspecified abdominal pain: Secondary | ICD-10-CM | POA: Diagnosis not present

## 2014-06-02 DIAGNOSIS — Z3A22 22 weeks gestation of pregnancy: Secondary | ICD-10-CM | POA: Insufficient documentation

## 2014-06-02 DIAGNOSIS — O26899 Other specified pregnancy related conditions, unspecified trimester: Secondary | ICD-10-CM

## 2014-06-02 HISTORY — DX: Sciatica, unspecified side: M54.30

## 2014-06-02 HISTORY — DX: Essential (primary) hypertension: I10

## 2014-06-02 LAB — URINALYSIS, ROUTINE W REFLEX MICROSCOPIC
BILIRUBIN URINE: NEGATIVE
Glucose, UA: 100 mg/dL — AB
KETONES UR: NEGATIVE mg/dL
Leukocytes, UA: NEGATIVE
Nitrite: NEGATIVE
PH: 6 (ref 5.0–8.0)
Protein, ur: NEGATIVE mg/dL
Urobilinogen, UA: 0.2 mg/dL (ref 0.0–1.0)

## 2014-06-02 LAB — URINE MICROSCOPIC-ADD ON

## 2014-06-02 NOTE — MAU Note (Signed)
C/o pain around her umbilicus;

## 2014-06-02 NOTE — MAU Provider Note (Signed)
History     CSN: [id number]  Arrival date and time: 06/02/14 1148   None     Chief Complaint  Patient presents with  . Abdominal Pain   HPI  Ms. Hoy Registerranessa Colclasure is a 39 y.o. G4P0020 at 5673w4d who presents to MAU today with complaint of constant pulling around the umbilicus for the last few days. She states pain is only present when she is sitting. She denies pain with change of positions. She denies erythema, swelling or a mass in the area. She denies vaginal bleeding, discharge or LOF. She states occasional nausea with eating but denies vomiting, diarrhea or constipation. She denies any history of abdominal surgeries.   OB History    Gravida Para Term Preterm AB TAB SAB Ectopic Multiple Living   4    2  2          Past Medical History  Diagnosis Date  . Frequent headaches   . Anemia   . PCOS (polycystic ovarian syndrome)   . Depression   . Sciatic pain   . Preterm labor   . Hypertension     Past Surgical History  Procedure Laterality Date  . Dilation and curettage of uterus    . Colposcopy      Family History  Problem Relation Age of Onset  . Hypertension Mother   . Heart disease Mother   . Hyperlipidemia Mother   . Heart disease Father   . Stomach cancer Father        . Hypertension Father   . Cancer Sister   . Diabetes Maternal Uncle   . Diabetes Paternal Aunt   . Thyroid disease Other   . Migraines Brother   . Migraines Mother     History  Substance Use Topics  . Smoking status: Former Smoker    Quit date: 01/17/2014  . Smokeless tobacco: Former NeurosurgeonUser    Quit date: 01/17/2014  . Alcohol Use: No     Comment: ocassionally- 2 per year    Allergies:  Allergies  Allergen Reactions  . Lortab [Hydrocodone-Acetaminophen] Itching    No prescriptions prior to admission    Review of Systems  Constitutional: Negative for fever.  Gastrointestinal: Positive for nausea and abdominal pain. Negative for vomiting, diarrhea and constipation.   Genitourinary: Negative for dysuria, urgency and frequency.       Neg - vaginal bleeding, discharge, LOF   Physical Exam   Blood pressure 114/77, pulse 107, temperature 97.9 F (36.6 C), resp. rate 18, last menstrual period 12/26/2013, unknown if currently breastfeeding.  Physical Exam  Constitutional: She is oriented to person, place, and time. She appears well-developed and well-nourished. No distress.  HENT:  Head: Normocephalic.  Cardiovascular: Normal rate.   Respiratory: Effort normal.  GI: Soft. Bowel sounds are normal. She exhibits no distension and no mass. There is no tenderness. There is no rebound and no guarding.  Neurological: She is alert and oriented to person, place, and time.  Skin: Skin is warm and dry. No erythema.  Psychiatric: She has a normal mood and affect.   Results for orders placed or performed during the hospital encounter of 06/02/14 (from the past 24 hour(s))  Urinalysis, Routine w reflex microscopic     Status: Abnormal   Collection Time: 06/02/14 12:00 PM  Result Value Ref Range   Color, Urine YELLOW YELLOW   APPearance CLEAR CLEAR   Specific Gravity, Urine >1.030 (H) 1.005 - 1.030   pH 6.0 5.0 - 8.0   Glucose,  UA 100 (A) NEGATIVE mg/dL   Hgb urine dipstick SMALL (A) NEGATIVE   Bilirubin Urine NEGATIVE NEGATIVE   Ketones, ur NEGATIVE NEGATIVE mg/dL   Protein, ur NEGATIVE NEGATIVE mg/dL   Urobilinogen, UA 0.2 0.0 - 1.0 mg/dL   Nitrite NEGATIVE NEGATIVE   Leukocytes, UA NEGATIVE NEGATIVE  Urine microscopic-add on     Status: Abnormal   Collection Time: 06/02/14 12:00 PM  Result Value Ref Range   Squamous Epithelial / LPF MANY (A) RARE   WBC, UA 3-6 <3 WBC/hpf   RBC / HPF 3-6 <3 RBC/hpf   Bacteria, UA FEW (A) RARE   Urine-Other MUCOUS PRESENT     MAU Course  Procedures None  MDM FHR - 150 bpm with doppler Discussed patient with Dr. Claiborne Billings. Ok for discharge. Follow-up as scheduled.  Assessment and Plan  A: SIUP at [redacted]w[redacted]d Abdominal  pain in pregnancy, antepartum  P: Discharge home Patient advised to try warm bath/shower and abdominal binder for pain Patient advised to follow-up in the office as scheduled for routine prenatal care or sooner PRN Patient may return to MAU as needed or if her condition were to change or worsen   Marny Lowenstein, PA-C  06/02/2014, 1:46 PM

## 2014-06-02 NOTE — Discharge Instructions (Signed)
Abdominal Pain During Pregnancy °Belly (abdominal) pain is common during pregnancy. Most of the time, it is not a serious problem. Other times, it can be a sign that something is wrong with the pregnancy. Always tell your doctor if you have belly pain. °HOME CARE °Monitor your belly pain for any changes. The following actions may help you feel better: °· Do not have sex (intercourse) or put anything in your vagina until you feel better. °· Rest until your pain stops. °· Drink clear fluids if you feel sick to your stomach (nauseous). Do not eat solid food until you feel better. °· Only take medicine as told by your doctor. °· Keep all doctor visits as told. °GET HELP RIGHT AWAY IF:  °· You are bleeding, leaking fluid, or pieces of tissue come out of your vagina. °· You have more pain or cramping. °· You keep throwing up (vomiting). °· You have pain when you pee (urinate) or have blood in your pee. °· You have a fever. °· You do not feel your baby moving as much. °· You feel very weak or feel like passing out. °· You have trouble breathing, with or without belly pain. °· You have a very bad headache and belly pain. °· You have fluid leaking from your vagina and belly pain. °· You keep having watery poop (diarrhea). °· Your belly pain does not go away after resting, or the pain gets worse. °MAKE SURE YOU:  °· Understand these instructions. °· Will watch your condition. °· Will get help right away if you are not doing well or get worse. °Document Released: 02/21/2009 Document Revised: 11/05/2012 Document Reviewed: 10/02/2012 °ExitCare® Patient Information ©2015 [employer]. This information is not intended to replace advice given to you by your health care provider. Make sure you discuss any questions you have with your health care provider. ° °

## 2014-06-03 ENCOUNTER — Telehealth: Payer: Self-pay | Admitting: *Deleted

## 2014-06-03 NOTE — Telephone Encounter (Signed)
Patient checking status of Short Term disability form completion.  Please call [phone] and advise.

## 2014-06-03 NOTE — Telephone Encounter (Signed)
Talked with pt and told her Dr. Lucia GaskinsAhern was out of the office today and I will ask her about them when she gets back in tomorrow. Pt verbalized understanding. Pt stated she received a call and would need the forms by next week.

## 2014-06-07 ENCOUNTER — Telehealth: Payer: Self-pay | Admitting: *Deleted

## 2014-06-07 ENCOUNTER — Ambulatory Visit: Payer: 59 | Admitting: Neurology

## 2014-06-07 NOTE — Telephone Encounter (Signed)
[facility] on 06/07/14.

## 2014-06-08 ENCOUNTER — Telehealth: Payer: Self-pay | Admitting: *Deleted

## 2014-06-08 NOTE — Telephone Encounter (Signed)
Talked with pt to let her know her disability forms are ready to be picked up at the front. Pt verbalized understanding.

## 2014-06-09 ENCOUNTER — Ambulatory Visit: Payer: 59 | Admitting: Physical Therapy

## 2014-06-09 DIAGNOSIS — M5410 Radiculopathy, site unspecified: Secondary | ICD-10-CM | POA: Diagnosis not present

## 2014-06-09 DIAGNOSIS — M6281 Muscle weakness (generalized): Secondary | ICD-10-CM

## 2014-06-09 NOTE — Therapy (Signed)
[facility] [address] [place], [place], [address] Phone: [phone]   Fax:  [phone]  Physical Therapy Treatment  Patient Details  Name: Alexandra Registerranessa Agrawal MRN: [id number] Date of Birth: 03/11/1976 Referring Provider:  Anson FretAhern, Antonia B, MD  Encounter Date: 06/09/2014      PT End of Session - 06/09/14 1447    Visit Number 2   Number of Visits 8   Date for PT Re-Evaluation 07/27/14   PT Start Time 0215   PT Stop Time 0307   PT Time Calculation (min) 52 min      Past Medical History  Diagnosis Date  . Frequent headaches   . Anemia   . PCOS (polycystic ovarian syndrome)   . Depression   . Sciatic pain   . Preterm labor   . Hypertension     Past Surgical History  Procedure Laterality Date  . Dilation and curettage of uterus    . Colposcopy      There were no vitals filed for this visit.  Visit Diagnosis:  Muscle weakness of lower extremity  Radicular pain of lower extremity      Subjective Assessment - 06/09/14 1417    Symptoms 6-7/10 pain. Increases after 10-15 minutes.                        [facility] Adult PT Treatment/Exercise - 06/09/14 0001    Lumbar Exercises: Supine   Other Supine Lumbar Exercises Decompression exercises  5 reps each supine on wedge  pt reports no noticeable decrease in pain afterwards   Other Supine Lumbar Exercises Side lying ER and IR in 45 degree hip and knee position using pillow between knees for comfort  also ankle EV x 20, with poor motor control   Modalities   Modalities Cryotherapy   Cryotherapy   Number Minutes Cryotherapy 10 Minutes   Cryotherapy Location Back  left lower in side lying   Type of Cryotherapy Ice pack   Manual Therapy   Manual Therapy Joint mobilization   Joint Mobilization Passive hip distraction in supine and side lying. Pt reports decreased pain with side lying.                 PT Education - 06/09/14 1501    Education  provided Yes   Education Details piriformis stretch, Hip ER/IR strength side lying   Person(s) Educated Patient   Methods Explanation;Handout   Comprehension Verbalized understanding          PT Short Term Goals - 06/01/14 1332    PT SHORT TERM GOAL #1   Title "Independent with initial HEP, walking program and basic self management techniques for pain control   Time 4   Period Weeks   Status New   PT SHORT TERM GOAL #2   Title "Demonstrate understanding of proper sitting posture and be more conscious of position and posture throughout the day.    Time 4   Period Weeks   Status New   PT SHORT TERM GOAL #3   Title Patient will report a 25% reduction in pain using activity modification, positioning   Time 4   Period Weeks           PT Long Term Goals - 06/01/14 1645    PT LONG TERM GOAL #1   Title "Pt will be independent with advanced HEP and self management/positioning   Time 8   Period Weeks   Status New  PT LONG TERM GOAL #2   Title "FOTO will improve from  70%  to    50%  indicating improved functional mobility with less pain   Time 8   Period Weeks   Status New   PT LONG TERM GOAL #3   Title Left ankle, knee, hip strength improved to 4/5 for greater ease with sit to stand from chair, toilet with decreased UE use.   Time 8   Period Weeks   Status New               Plan - 06/09/14 1708    Clinical Impression Statement Pt reports decreased pain with hip distraction in clinic and she was unsure if decompression was helpful.  She reports her pain is about the same as last time. She reports her worst pain is in sitting and sitting on the toilet. She receive injections weekly in her proximal posterior glut to decrease her chances of premature labor. She feels the swelling and possibly the pain could be coming from those.     PT Next Visit Plan Left sidelying with LE ROM/strengthening; head of bed elevated 50 degrees (secondary to pregnancy) with decompressive  exercises;  same modified position with left LE hip distraction; Nu-Step?        Problem List Patient Active Problem List   Diagnosis Date Noted  . Migraine 04/27/2014  . [redacted] weeks gestation of pregnancy   . 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 Pregnancy 04/04/2014  . High-risk pregnancy 01/22/2014    Sherrie Mustache, PTA 06/09/2014, 5:14 PM  [facility] [address] [place], [place], [address] Phone: [phone]   Fax:  [phone]

## 2014-06-09 NOTE — Patient Instructions (Addendum)
Clam   Lie on side, legs bent 45 degrees. Open top knee to ceiling, rotating leg outward x 10-20 reps. Then,  Close knees and lift foot off bottom foot 10-20 reps.  Repeat __1-2__ times.  Do __2__ sessions per day.  Copyright  VHI. All rights reserved.  Piriformis Stretch, Sitting   Sit, one ankle on opposite knee, same-side hand on crossed knee. Push down on knee, keeping spine straight.  Hold 30___ seconds.  Repeat _3__ times per session. Do _3__ sessions per day.  Copyright  VHI. All rights reserved.

## 2014-06-10 ENCOUNTER — Telehealth: Payer: Self-pay | Admitting: *Deleted

## 2014-06-10 NOTE — Telephone Encounter (Signed)
Talked with Golden PopBrenda Wheeler from disability and she would like Dr. Lucia GaskinsAhern to call her back about disability form for pt. She is confused on why pt is now eligible.  I told her I would have Dr. Lucia GaskinsAhern give her a call back. Steward DroneBrenda verbalized understanding and said Dr. Lucia GaskinsAhern can leave a detailed message if she doesn't get through to her.

## 2014-06-10 NOTE — Telephone Encounter (Signed)
Disability is calling wanting needing some clarification of the form that was sent to them. They said 1st the patient was not eligible but now she is. Please call  Golden PopBrenda Wheeler [phone]

## 2014-06-11 NOTE — Telephone Encounter (Signed)
Left message for coordinator. If she wishes to speak to me about patient, she can call me back. I have seen patient in clinic subsequent to speaking with her previously. I filled out the disability forms and also had my office notes and patient's physical therapy notes faxed as well with the disability forms  Thank you.

## 2014-06-14 NOTE — Telephone Encounter (Signed)
Patient requesting a return call regarding disability forms.  Please return call @ [phone].

## 2014-06-15 ENCOUNTER — Ambulatory Visit: Payer: 59 | Admitting: Physical Therapy

## 2014-06-15 VITALS — BP 130/84 | HR 100

## 2014-06-15 DIAGNOSIS — M5410 Radiculopathy, site unspecified: Secondary | ICD-10-CM

## 2014-06-15 DIAGNOSIS — M6281 Muscle weakness (generalized): Secondary | ICD-10-CM

## 2014-06-15 NOTE — Therapy (Addendum)
[place], [place], [address] Phone: [phone]   Fax:  [phone]  Physical Therapy Treatment  Patient Details  Name: Alexandra [name] MRN: [id number] Date of Birth: Jul 11, 1975 Referring Provider:  Melvenia Beam, MD  Encounter Date: 06/15/2014      PT End of Session - 06/15/14 1052    Visit Number 3   Number of Visits 8   Date for PT Re-Evaluation 07/27/14   PT Start Time 1638   PT Stop Time 1150   PT Time Calculation (min) 65 min      Past Medical History  Diagnosis Date  . Frequent headaches   . Anemia   . PCOS (polycystic ovarian syndrome)   . Depression   . Sciatic pain   . Preterm labor   . Hypertension     Past Surgical History  Procedure Laterality Date  . Dilation and curettage of uterus    . Colposcopy      Filed Vitals:   06/15/14 1125  BP: 130/84  Pulse: 100  SpO2: 97%    Visit Diagnosis:  Muscle weakness of lower extremity  Radicular pain of lower extremity      Subjective Assessment - 06/15/14 1049    Symptoms LBP 7/10. I have been having abdominal cramps and they may be putting me on bed rest.    Aggravating Factors  sitting more than 10 minutes   Pain Relieving Factors sidelying                       [facility] Adult PT Treatment/Exercise - 06/15/14 1059    Lumbar Exercises: Stretches   Active Hamstring Stretch 3 reps;30 seconds   Active Hamstring Stretch Limitations seated left leg   Passive Hamstring Stretch 3 reps;30 seconds   Passive Hamstring Stretch Limitations sidelying due to pregnancy   Piriformis Stretch 3 reps;30 seconds  sidelying and seated   Piriformis Stretch Limitations modified in sidelying and passive, increased LBP with stretch.   Lumbar Exercises: Supine   Other Supine Lumbar Exercises Decompression exercises  5 reps each supine on wedge  pt reports no noticeable decrease in pain afterwards   Other Supine Lumbar Exercises  Side lying ER and IR in 45 degree hip and knee position using pillow between knees for comfort  also ankle EV x 20, with poor motor control   Modalities   Modalities Cryotherapy   Cryotherapy   Number Minutes Cryotherapy 10 Minutes   Cryotherapy Location Back  supine head elevated 50 degrees   Type of Cryotherapy Ice pack   Manual Therapy   Joint Mobilization Passive hip distraction in supine and side lying. Pt reports decreased pain with side lying.                 PT Education - 06/15/14 1137    Education provided Yes   Education Details Decompression supine at 50 degree head of bed elevation   Person(s) Educated Patient   Methods Explanation;Handout   Comprehension Verbalized understanding          PT Short Term Goals - 06/01/14 1332    PT SHORT TERM GOAL #1   Title "Independent with initial HEP, walking program and basic self management techniques for pain control   Time 4   Period Weeks   Status New   PT SHORT TERM GOAL #2   Title "Demonstrate understanding of proper sitting posture and be more conscious of position and posture throughout  the day.    Time 4   Period Weeks   Status New   PT SHORT TERM GOAL #3   Title Patient will report a 25% reduction in pain using activity modification, positioning   Time 4   Period Weeks           PT Long Term Goals - 06/01/14 1645    PT LONG TERM GOAL #1   Title "Pt will be independent with advanced HEP and self management/positioning   Time 8   Period Weeks   Status New   PT LONG TERM GOAL #2   Title "FOTO will improve from  70%  to    50%  indicating improved functional mobility with less pain   Time 8   Period Weeks   Status New   PT LONG TERM GOAL #3   Title Left ankle, knee, hip strength improved to 4/5 for greater ease with sit to stand from chair, toilet with decreased UE use.   Time 8   Period Weeks   Status New               Plan - 06/15/14 1108    Clinical Impression Statement Pt  reports she was able to sleep all night without waking after last session, however pain returned the next day. She is being monitored closely for possible pre-term labor and anticipates being on bed rest soon due to high risk pregnancy. She complains of SOB while lying supine at 50 degree head of bed elevation. Vitals taken and resting HR of 100 bpm. Pt reports her MD is aware. Decompression exercies feel good while she is doing them.    PT Next Visit Plan Continue sidelying hip strength, supine (elevated) decompression exercies;  same modified positions with left LE hip distraction; Nu-Step?         Problem List Patient Active Problem List   Diagnosis Date Noted  . Migraine 04/27/2014  . [redacted] weeks gestation of pregnancy   . 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 Pregnancy 04/04/2014  . High-risk pregnancy 01/22/2014    Dorene Ar, PTA 06/15/2014, 11:40 AM  [facility] [address] [place], [place], [address] Phone: [phone]   Fax:  [phone]     PHYSICAL THERAPY DISCHARGE SUMMARY  Visits from Start of Care: 3  Current functional level related to goals / functional outcomes: No progress toward goals   Remaining deficits: Patient reports she is having signs of pre-term labor in her high risk pregnancy and may be put on bedrest if her "levels drop slightly more."  Recommend discontinuing PT at this time secondary to a change in medical status.     Education / Equipment: HEP on hold Plan: Patient agrees to discharge.  Patient goals were not met. Patient is being discharged due to a change in medical status.  ?????  Ruben Im, PT 06/22/2014 9:01 AM Phone: [phone] Fax: [phone]

## 2014-06-21 ENCOUNTER — Ambulatory Visit (INDEPENDENT_AMBULATORY_CARE_PROVIDER_SITE_OTHER): Payer: 59 | Admitting: Neurology

## 2014-06-21 ENCOUNTER — Encounter: Payer: Self-pay | Admitting: Neurology

## 2014-06-21 VITALS — BP 128/79 | HR 104 | Temp 97.9°F | Ht 71.0 in | Wt 220.5 lb

## 2014-06-21 DIAGNOSIS — G43009 Migraine without aura, not intractable, without status migrainosus: Secondary | ICD-10-CM

## 2014-06-21 NOTE — Progress Notes (Signed)
[facility]    Provider:  Dr Lucia GaskinsAhern Referring Provider: No ref. provider found Primary Care Physician:  No PCP Per Patient  CC:  Headache  Interval History 06/21/2014:  Alexandra [name] is a 39 y.o. female here as a follow up. She is having headaches every day, worsening likely due to stress. She is not taking the Labetalol. The increase in headaches just started last week. Previous was 2-3x a week. She is following closely with her obgyn and was hospitalized last week for preterm labor. Her sinuses are fine, so issues from the last time she was here, no congestion or bloody discharge. Headaches last a few hours a day. Sharp pain in the front and vertex. Pulsating, light sensitivity, nause, same symptoms as previously.   05/26/2014: The right nostril feels clogged. A little congested. Clear and funny. The headaches persist. They are hitting her 3-4x a week. They are getting wrose at night than during the day. She is up every 30 minutes with a headache. She is snoring very loud. She sounds like a "loud motor car" every night. She sleeps on her side, or at least tried sometimes she finds her.   MRI of the brain normal  05/18/2014: She has migraines for more than 14 years, was following with [facility]. Prior to that she was on Zomig nasal spray. She has tried Topamax in the past, she doesn't really know what medications as there were so many. She is in her second trimester. She was recently given Fioricet and phenergan not working. She was placed on Flexeril but it didn't help. She has tried Tylenol which doesn't work. Verapamil didn't work either and she is scared to take medication while pregnant. Migraines are mostly left sided, she is having left-sided numbness with the headaches, she is having worsening headaches every day. Going into a dark room helps. They are pulsating, pain before the left eye, light sensitivity, sound sensitivity, perfume and smells trigger her  headaches. Mother has migraines. Father has migraines. Brother has headaches. They last all day, tylenol may help a little but she needs a dark room otherwise she can have 10/10 pain for 10 hours straight. No aura. Will do Sphenocath today, consented patient appropriately. See procedure note below.  Reviewed notes, labs and imaging from outside physicians, which showed: Patient was seen in urgent care for migraines multiple times recently. She has severe anxiety dur to previous premature rupture of membranes at 15 weeks in her last pregnancy, was reassured that she was fine and sent home. EKG showed NSR. CMP on 1/26 showed sodium of 133, AST and ALT slightly elevated. RPR and HIV 02/03/14 neg. Abnormal MRI of the brain without contrast showing about 30 small round hyperintensity foci on FLAIR images predominantly in the subcortical white matter of the frontal lobes. This is a nonspecific finding. These foci could be idiopathic or due to small chronic ischemic change from migraine headache, prior trauma or infection, cardio emboli, vasculitis or small vessel ischemic disease. Demyelination would be less likely to have this pattern.   Review of Systems: Patient complains of symptoms per HPI as well as the following symptoms: eye discharge, light sensitivty, double vision, blurred vision, excessibve thirst, abdominal pain, SOB, hearing loss, chest tightness, palpitations, restless legs, snoring,drooling,nausea,vomiting. Pertinent negatives per HPI. All others negative.   History   Social History  . Marital Status: Single    Spouse Name: N/A  . Number of Children: N/A  . Years of Education: N/A  Occupational History  . [employer] Other   Social History Main Topics  . Smoking status: Former Smoker    Quit date: 01/17/2014  . Smokeless tobacco: Former Neurosurgeon    Quit date: 01/17/2014  . Alcohol Use: No     Comment: ocassionally- 2 per year  . Drug Use: No  . Sexual Activity: Yes    Birth Control/  Protection: None   Other Topics Concern  . 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   Has a bachelor's degree.   Caffeine use: 2 cups of pepsi/day     Family History  Problem Relation Age of Onset  . Hypertension Mother   . Heart disease Mother   . Hyperlipidemia Mother   . Heart disease Father   . Stomach cancer Father        . Hypertension Father   . Cancer Sister   . Diabetes Maternal Uncle   . Diabetes Paternal Aunt   . Thyroid disease Other   . Migraines Brother   . Migraines Mother     Past Medical History  Diagnosis Date  . Frequent headaches   . Anemia   . PCOS (polycystic ovarian syndrome)   . Depression   . Sciatic pain   . Preterm labor   . Hypertension     Past Surgical History  Procedure Laterality Date  . Dilation and curettage of uterus    . Colposcopy      Current Outpatient Prescriptions  Medication Sig Dispense Refill  . cyclobenzaprine (FLEXERIL) 10 MG tablet Take 10 mg by mouth 3 (three) times daily as needed for muscle spasms.    . hydroxyprogesterone caproate (DELALUTIN) 250 mg/mL OIL injection Inject 250 mg into the muscle once a week.    . mometasone (NASONEX) 50 MCG/ACT nasal spray Place 2 sprays into the nose daily. 17 g 12  . OVER THE COUNTER MEDICATION Take 1 tablet by mouth 3 (three) times daily before meals. Gas X    . prenatal vitamin w/FE, FA (PRENATAL 1 + 1) 27-1 MG TABS tablet Take 1 tablet by mouth daily at 12 noon.    . promethazine (PHENERGAN) 25 MG tablet Take 1 tablet (25 mg total) by mouth every 6 (six) hours as needed for nausea or vomiting. 30 tablet 0  . labetalol (NORMODYNE) 100 MG tablet Take 1 tablet (100 mg total) by mouth 2 (two) times daily. (Patient not taking: Reported on 06/02/2014) 60 tablet 3   No current facility-administered medications for this visit.    Allergies as of 06/21/2014 - Review Complete 06/21/2014  Allergen Reaction Noted  . Lortab [hydrocodone-acetaminophen]  Itching 03/03/2013    Vitals: BP 128/79 mmHg  Pulse 104  Temp(Src) 97.9 F (36.6 C)  Ht  (1.803 m)  Wt 220 lb 8 oz (100.018 kg)  BMI 30.77 kg/m2  LMP 12/26/2013 Last Weight:  Wt Readings from Last 1 Encounters:  06/21/14 220 lb 8 oz (100.018 kg)   Last Height:   Ht Readings from Last 1 Encounters:  06/21/14  (1.803 m)     Cranial Nerves:  The pupils are equal, round, and reactive to light. The fundi are normal and spontaneous venous pulsations are present. Visual fields are full to finger confrontation. Extraocular movements are intact. Trigeminal sensation is intact and the muscles of mastication are normal. The face is symmetric. The palate elevates in the midline. Hearing intact. Voice is normal. Shoulder shrug is normal. The tongue  has normal motion without fasciculations.   Gait: normal native gait  Motor Observation:  No asymmetry, no atrophy, and no involuntary movements noted. Tone:  Normal muscle tone.   Posture:  Posture is normal.    Strength:  Strength is V/V in the upper and lower limbs.    Assessment/Plan: 39 year old female in her second trimester with migraines without aura. Neuro exam is non focal. Worsening headaches with temporary monocular vision loss, workup has been negative. Suspect there is an aspect of severe anxiety due to previous pregnancy complication.    Recommend Labetalol daily for migraine prevention. Class B, cannot rule out fetal risks. She is not taking.  Flonase for the sinuses, likely due to allergies She should stop PT for LBP until approval from OBGYN Will hold off anymore headache preventative medications at this time until after delivery  Naomie [name], MD  [facility] [address] [place], [place] [address]  Phone [phone] Fax [phone]  A total of 15 minutes was spent face-to-face with this patient. Over half this time was spent on counseling  patient on the migraine diagnosis and different diagnostic and therapeutic options available.

## 2014-06-21 NOTE — Patient Instructions (Signed)
Overall you are doing fairly well but I do want to suggest a few things today:   Remember to drink plenty of fluid, eat healthy meals and do not skip any meals. Try to eat protein with a every meal and eat a healthy snack such as fruit or nuts in between meals. Try to keep a regular sleep-wake schedule and try to exercise daily, particularly in the form of walking, 20-30 minutes a day, if you can.   I would like to see you back as needed, sooner if we need to. Please call us with any interim questions, concerns, problems, updates or refill requests.   Please also call us for any test results so we can go over those with you on the phone.  My clinical assistant and will answer any of your questions and relay your messages to me and also relay most of my messages to you.   Our phone number is [phone]. We also have an after hours call service for urgent matters and there is a physician on-call for urgent questions. For any emergencies you know to call 911 or go to the nearest emergency room   

## 2014-06-22 ENCOUNTER — Encounter (HOSPITAL_COMMUNITY): Payer: Self-pay | Admitting: *Deleted

## 2014-06-22 ENCOUNTER — Encounter: Payer: 59 | Admitting: Physical Therapy

## 2014-06-22 ENCOUNTER — Inpatient Hospital Stay (HOSPITAL_COMMUNITY)
Admission: AD | Admit: 2014-06-22 | Discharge: 2014-06-22 | Disposition: A | Discharge status: Home / Self Care | Payer: 59 | Source: Ambulatory Visit | Attending: Obstetrics & Gynecology | Admitting: Obstetrics & Gynecology

## 2014-06-22 DIAGNOSIS — O4702 False labor before 37 completed weeks of gestation, second trimester: Secondary | ICD-10-CM

## 2014-06-22 DIAGNOSIS — Z87891 Personal history of nicotine dependence: Secondary | ICD-10-CM | POA: Diagnosis not present

## 2014-06-22 DIAGNOSIS — Z3A25 25 weeks gestation of pregnancy: Secondary | ICD-10-CM | POA: Diagnosis not present

## 2014-06-22 DIAGNOSIS — M545 Low back pain: Secondary | ICD-10-CM | POA: Insufficient documentation

## 2014-06-22 LAB — URINALYSIS, ROUTINE W REFLEX MICROSCOPIC
Bilirubin Urine: NEGATIVE
Glucose, UA: 100 mg/dL — AB
KETONES UR: 15 mg/dL — AB
Leukocytes, UA: NEGATIVE
Nitrite: NEGATIVE
PROTEIN: NEGATIVE mg/dL
Specific Gravity, Urine: 1.025 (ref 1.005–1.030)
Urobilinogen, UA: 0.2 mg/dL (ref 0.0–1.0)
pH: 6 (ref 5.0–8.0)

## 2014-06-22 LAB — URINE MICROSCOPIC-ADD ON

## 2014-06-22 MED ORDER — NIFEDIPINE 10 MG PO CAPS: 10.0000 mg | ORAL_CAPSULE | Freq: Four times a day (QID) | ORAL | Status: DC | PRN

## 2014-06-22 MED ORDER — BETAMETHASONE SOD PHOS & ACET 6 (3-3) MG/ML IJ SUSP
12.0000 mg | Freq: Once | INTRAMUSCULAR | Status: AC
  Administered 2014-06-22: 12 mg via INTRAMUSCULAR
  Filled 2014-06-22: qty 2

## 2014-06-22 MED ORDER — NIFEDIPINE 10 MG PO CAPS
10.0000 mg | ORAL_CAPSULE | Freq: Once | ORAL | Status: AC
  Administered 2014-06-22: 10 mg via ORAL
  Filled 2014-06-22: qty 1

## 2014-06-22 NOTE — MAU Note (Signed)
Lower back pain that radiates around to LLQ for the past 2 days, cervix was 2.8 cm's long last Monday, Pos FFN last Wednesday.  Denies bleeding, denies gushes of fluid but states her panti liner is wet in the mornings for the past 5 days.

## 2014-06-22 NOTE — MAU Provider Note (Signed)
History     CSN: [id number]  Arrival date and time: 06/22/14 1016   None     Chief Complaint  Patient presents with  . Abdominal Pain  . Back Pain   HPI This is a 39 y.o. female at [redacted]w[redacted]d who presents with c/o low back pain which radiates to LLQ. Has been going on for 2 days. Has been followed in the office for Positive FFn and shortening cervix.  Has no bleeding or leaking. States liners have been moist, but no gross leakage.  Prenatal records not in media tab  RN Note: Lower back pain that radiates around to LLQ for the past 2 days, cervix was 2.8 cm's long last Monday, Pos FFN last Wednesday. Denies bleeding, denies gushes of fluid but states her panti liner is wet in the mornings for the past 5 days.          OB History    Gravida Para Term Preterm AB TAB SAB Ectopic Multiple Living   Past Medical History  Diagnosis Date  . Frequent headaches   . Anemia   . PCOS (polycystic ovarian syndrome)   . Depression   . Sciatic pain   . Preterm labor   . Hypertension     Past Surgical History  Procedure Laterality Date  . Dilation and curettage of uterus    . Colposcopy      Family History  Problem Relation Age of Onset  . Hypertension Mother   . Heart disease Mother   . Hyperlipidemia Mother   . Heart disease Father   . Stomach cancer Father        . Hypertension Father   . Cancer Sister   . Diabetes Maternal Uncle   . Diabetes Paternal Aunt   . Thyroid disease Other   . Migraines Brother   . Migraines Mother     History  Substance Use Topics  . Smoking status: Former Smoker    Quit date: 01/17/2014  . Smokeless tobacco: Former Neurosurgeon    Quit date: 01/17/2014  . Alcohol Use: No     Comment: ocassionally- 2 per year    Allergies:  Allergies  Allergen Reactions  . Lortab [Hydrocodone-Acetaminophen] Itching    Prescriptions prior to admission  Medication Sig Dispense Refill Last Dose  . cyclobenzaprine (FLEXERIL) 10 MG  tablet Take 10 mg by mouth 3 (three) times daily as needed for muscle spasms.   Past Week at Unknown time  . hydroxyprogesterone caproate (DELALUTIN) 250 mg/mL OIL injection Inject 250 mg into the muscle once a week.   Past Week at Unknown time  . labetalol (NORMODYNE) 100 MG tablet Take 1 tablet (100 mg total) by mouth 2 (two) times daily. (Patient taking differently: Take 100 mg by mouth once. ) 60 tablet 3 06/22/2014 at Unknown time  . prenatal vitamin w/FE, FA (PRENATAL 1 + 1) 27-1 MG TABS tablet Take 1 tablet by mouth daily at 12 noon.   06/21/2014 at Unknown time  . promethazine (PHENERGAN) 25 MG tablet Take 1 tablet (25 mg total) by mouth every 6 (six) hours as needed for nausea or vomiting. 30 tablet 0 Past Week at Unknown time  . Simethicone (GAS-X PO) Take 1 tablet by mouth daily as needed.   Past Week at Unknown time  . mometasone (NASONEX) 50 MCG/ACT nasal spray Place 2 sprays into the nose daily. 17 g 12 Taking  Review of Systems  Constitutional: Negative for fever, chills and malaise/fatigue.  Gastrointestinal: Positive for abdominal pain. Negative for nausea, vomiting, diarrhea and constipation.  Genitourinary: Negative for dysuria.  Neurological: Negative for headaches.   Physical Exam   Blood pressure 125/84, pulse 113, temperature 97.6 F (36.4 C), temperature source Oral, resp. rate 20, last menstrual period 12/26/2013, unknown if currently breastfeeding.  Physical Exam  Constitutional: She is oriented to person, place, and time. She appears well-developed and well-nourished. No distress.  HENT:  Head: Normocephalic.  Cardiovascular: Normal rate and regular rhythm.   Respiratory: Effort normal. No respiratory distress.  GI: Soft. She exhibits no distension and no mass. There is tenderness (slightly tender over round ligaments). There is no rebound and no guarding.  Genitourinary: Vaginal discharge (thick creamy discharge, no pooling, no ferning) found.  Musculoskeletal:  Normal range of motion.  Neurological: She is alert and oriented to person, place, and time.  Skin: Skin is warm and dry.  Psychiatric: She has a normal mood and affect.   Fetal heart rate reassuring UCs every 3-4 minutes, mild, difficult to see  MAU Course  Procedures  MDM Procardia given.>  Good resolution of contractions.  Results for orders placed or performed during the hospital encounter of 06/22/14 (from the past 72 hour(s))  Urinalysis, Routine w reflex microscopic     Status: Abnormal   Collection Time: 06/22/14 10:50 AM  Result Value Ref Range   Color, Urine YELLOW YELLOW   APPearance CLEAR CLEAR   Specific Gravity, Urine 1.025 1.005 - 1.030   pH 6.0 5.0 - 8.0   Glucose, UA 100 (A) NEGATIVE mg/dL   Hgb urine dipstick TRACE (A) NEGATIVE   Bilirubin Urine NEGATIVE NEGATIVE   Ketones, ur 15 (A) NEGATIVE mg/dL   Protein, ur NEGATIVE NEGATIVE mg/dL   Urobilinogen, UA 0.2 0.0 - 1.0 mg/dL   Nitrite NEGATIVE NEGATIVE   Leukocytes, UA NEGATIVE NEGATIVE  Urine microscopic-add on     Status: Abnormal   Collection Time: 06/22/14 10:50 AM  Result Value Ref Range   Squamous Epithelial / LPF MANY (A) RARE   WBC, UA 0-2 <3 WBC/hpf   RBC / HPF 0-2 <3 RBC/hpf   Bacteria, UA FEW (A) RARE   Urine-Other MUCOUS PRESENT      Assessment and Plan  A:  SIUP at [id number]       Preterm contractions, with + FFn last week, no change in cervix today   P:  Discharge home after consultation with Dr Mora ApplPinn.        Rx Procardia for PRN use at home       Betamethasone given       Come back tomorrow for second betamethasone  [facility] LLCWILLIAMS,MARIE 06/22/2014, 11:44 AM

## 2014-06-22 NOTE — Discharge Instructions (Signed)
Pelvic Rest °Pelvic rest is sometimes recommended for women when:  °· The placenta is partially or completely covering the opening of the cervix (placenta previa). °· There is bleeding between the uterine wall and the amniotic sac in the first trimester (subchorionic hemorrhage). °· The cervix begins to open without labor starting (incompetent cervix, cervical insufficiency). °· The labor is too early (preterm labor). °HOME CARE INSTRUCTIONS °· Do not have sexual intercourse, stimulation, or an orgasm. °· Do not use tampons, douche, or put anything in the vagina. °· Do not lift anything over 10 pounds (4.5 kg). °· Avoid strenuous activity or straining your pelvic muscles. °SEEK MEDICAL CARE IF:  °· You have any vaginal bleeding during pregnancy. Treat this as a potential emergency. °· You have cramping pain felt low in the stomach (stronger than menstrual cramps). °· You notice vaginal discharge (watery, mucus, or bloody). °· You have a low, dull backache. °· There are regular contractions or uterine tightening. °SEEK IMMEDIATE MEDICAL CARE IF: °You have vaginal bleeding and have placenta previa.  °Document Released: 06/30/2010 Document Revised: 05/28/2011 Document Reviewed: 06/30/2010 °ExitCare® Patient Information ©2015 [employer]. This information is not intended to replace advice given to you by your health care provider. Make sure you discuss any questions you have with your health care provider. ° °Preterm Labor Information °Preterm labor is when labor starts at less than 37 weeks of pregnancy. The normal length of a pregnancy is 39 to 41 weeks. °CAUSES °Often, there is no identifiable underlying cause as to why a woman goes into preterm labor. One of the most common known causes of preterm labor is infection. Infections of the uterus, cervix, vagina, amniotic sac, bladder, kidney, or even the lungs (pneumonia) can cause labor to start. Other suspected causes of preterm labor include:  °· Urogenital  infections, such as yeast infections and bacterial vaginosis.   °· Uterine abnormalities (uterine shape, uterine septum, fibroids, or bleeding from the placenta).   °· A cervix that has been operated on (it may fail to stay closed).   °· Malformations in the fetus.   °· Multiple gestations (twins, triplets, and so on).   °· Breakage of the amniotic sac.   °RISK FACTORS °· Having a previous history of preterm labor.   °· Having premature rupture of membranes (PROM).   °· Having a placenta that covers the opening of the cervix (placenta previa).   °· Having a placenta that separates from the uterus (placental abruption).   °· Having a cervix that is too weak to hold the fetus in the uterus (incompetent cervix).   °· Having too much fluid in the amniotic sac (polyhydramnios).   °· Taking illegal drugs or smoking while pregnant.   °· Not gaining enough weight while pregnant.   °· Being younger than 18 and older than 39 years old.   °· Having a low socioeconomic status.   °· Being African American. °SYMPTOMS °Signs and symptoms of preterm labor include:  °· Menstrual-like cramps, abdominal pain, or back pain. °· Uterine contractions that are regular, as frequent as six in an hour, regardless of their intensity (may be mild or painful). °· Contractions that start on the top of the uterus and spread down to the lower abdomen and back.   °· A sense of increased pelvic pressure.   °· A watery or bloody mucus discharge that comes from the vagina.   °TREATMENT °Depending on the length of the pregnancy and other circumstances, your health care provider may suggest bed rest. If necessary, there are medicines that can be given to stop contractions and to mature the   fetal lungs. If labor happens before 34 weeks of pregnancy, a prolonged hospital stay may be recommended. Treatment depends on the condition of both you and the fetus.  WHAT SHOULD YOU DO IF YOU THINK YOU ARE IN PRETERM LABOR? Call your health care provider right  away. You will need to go to the hospital to get checked immediately. HOW CAN YOU PREVENT PRETERM LABOR IN FUTURE PREGNANCIES? You should:   Stop smoking if you smoke.  Maintain healthy weight gain and avoid chemicals and drugs that are not necessary.  Be watchful for any type of infection.  Inform your health care provider if you have a known history of preterm labor. Document Released: 05/26/2003 Document Revised: 11/05/2012 Document Reviewed: 04/07/2012 [facility] Patient Information 2015 [place], [place]. This information is not intended to replace advice given to you by your health care provider. Make sure you discuss any questions you have with your health care provider.  Fetal Fibronectin This is a test done to help evaluate a pregnant woman's risk of pre-term delivery. It is generally done when you are 26 to [redacted] weeks pregnant and are having symptoms of premature labor. A Dacron swab is used to take a sample of cervical or vaginal fluid from the back portion of the vagina or from the area just outside the opening of the cervix. Fetal fibronectin (fFN) is a glycoprotein that can be used to help predict the short term risk of premature delivery. fFN is produced at the boundary between the amniotic sac and the lining of the mother's uterus. This is called the unteroplacental junction. Fetal fibronectin is largely confined to this junction and thought to help maintain the integrity of the boundary. fFN is normally detectable in cervicovaginal fluid during the first 20 to 24 weeks of pregnancy, and then is detectable again after about 36 weeks.  Finding fFN in cervicovaginal fluids after 36 weeks is not unusual as it is often released by the body as it gets ready for childbirth. The elevated fFN found in vaginal fluids early in pregnancy may simply reflect the normal growth and establishment of tissues at the unteroplacental junction with levels falling when this phase is complete. What is known is  that fFN that is detected between 24 and 36 weeks of pregnancy is not normal. Elevated levels reflect a disturbance at the uteroplacental junction and have been associated with an increased risk of pre-term labor and delivery. Knowing whether or not a woman is likely to deliver prematurely helps your caregiver plan a course of action. The fFN test is a relatively non-invasive tool to help the caregiver to distinguish between those who are likely to deliver shortly and those who are not.  PREPARATION FOR TEST   Inform the person conducting the test if you have a medical condition or are using any medications that cause excessive bleeding.  Do not have sexual intercourse for 24 hours before the procedure. NORMAL FINDINGS  Pregnancy = 50 nanograms/ml Ranges for normal findings may vary among different laboratories and hospitals. You should always check with your doctor after having lab work or other tests done to discuss the meaning of your test results and whether your values are considered within normal limits. MEANING OF TEST  Your caregiver will go over the test results with you and discuss the importance and meaning of your results, as well as treatment options and the need for additional tests if necessary. OBTAINING THE TEST RESULTS  It is your responsibility to obtain your test results.  Ask the lab or department performing the test when and how you will get your results. Document Released: 01/05/2004 Document Revised: 05/28/2011 Document Reviewed: 06/01/2013 [facility] Patient Information 2015 [place], [place]. This information is not intended to replace advice given to you by your health care provider. Make sure you discuss any questions you have with your health care provider.

## 2014-06-23 ENCOUNTER — Inpatient Hospital Stay (HOSPITAL_COMMUNITY)
Admission: EM | Admit: 2014-06-23 | Discharge: 2014-06-23 | Disposition: A | Discharge status: Home / Self Care | Payer: 59 | Source: Ambulatory Visit | Attending: Obstetrics and Gynecology | Admitting: Obstetrics and Gynecology

## 2014-06-23 DIAGNOSIS — Z3A28 28 weeks gestation of pregnancy: Secondary | ICD-10-CM | POA: Diagnosis not present

## 2014-06-23 MED ORDER — BETAMETHASONE SOD PHOS & ACET 6 (3-3) MG/ML IJ SUSP
12.0000 mg | Freq: Once | INTRAMUSCULAR | Status: AC
  Administered 2014-06-23: 12 mg via INTRAMUSCULAR
  Filled 2014-06-23: qty 2

## 2014-06-29 ENCOUNTER — Encounter: Payer: 59 | Admitting: Physical Therapy

## 2014-07-13 ENCOUNTER — Inpatient Hospital Stay (HOSPITAL_COMMUNITY)
Admission: AD | Admit: 2014-07-13 | Discharge: 2014-07-13 | Disposition: A | Discharge status: Home / Self Care | Payer: 59 | Source: Ambulatory Visit | Attending: Obstetrics and Gynecology | Admitting: Obstetrics and Gynecology

## 2014-07-13 ENCOUNTER — Encounter (HOSPITAL_COMMUNITY): Payer: Self-pay | Admitting: *Deleted

## 2014-07-13 ENCOUNTER — Inpatient Hospital Stay (HOSPITAL_COMMUNITY): Payer: 59

## 2014-07-13 DIAGNOSIS — K219 Gastro-esophageal reflux disease without esophagitis: Secondary | ICD-10-CM

## 2014-07-13 DIAGNOSIS — O9989 Other specified diseases and conditions complicating pregnancy, childbirth and the puerperium: Secondary | ICD-10-CM | POA: Insufficient documentation

## 2014-07-13 DIAGNOSIS — Z87891 Personal history of nicotine dependence: Secondary | ICD-10-CM | POA: Diagnosis not present

## 2014-07-13 DIAGNOSIS — O99613 Diseases of the digestive system complicating pregnancy, third trimester: Secondary | ICD-10-CM

## 2014-07-13 DIAGNOSIS — O10013 Pre-existing essential hypertension complicating pregnancy, third trimester: Secondary | ICD-10-CM | POA: Insufficient documentation

## 2014-07-13 DIAGNOSIS — O26899 Other specified pregnancy related conditions, unspecified trimester: Secondary | ICD-10-CM | POA: Insufficient documentation

## 2014-07-13 DIAGNOSIS — O26893 Other specified pregnancy related conditions, third trimester: Secondary | ICD-10-CM

## 2014-07-13 DIAGNOSIS — Z3A28 28 weeks gestation of pregnancy: Secondary | ICD-10-CM | POA: Insufficient documentation

## 2014-07-13 DIAGNOSIS — R0602 Shortness of breath: Secondary | ICD-10-CM | POA: Diagnosis not present

## 2014-07-13 DIAGNOSIS — R1010 Upper abdominal pain, unspecified: Secondary | ICD-10-CM | POA: Diagnosis present

## 2014-07-13 DIAGNOSIS — O09293 Supervision of pregnancy with other poor reproductive or obstetric history, third trimester: Secondary | ICD-10-CM

## 2014-07-13 DIAGNOSIS — R102 Pelvic and perineal pain: Secondary | ICD-10-CM

## 2014-07-13 DIAGNOSIS — O09299 Supervision of pregnancy with other poor reproductive or obstetric history, unspecified trimester: Secondary | ICD-10-CM | POA: Insufficient documentation

## 2014-07-13 DIAGNOSIS — N949 Unspecified condition associated with female genital organs and menstrual cycle: Secondary | ICD-10-CM

## 2014-07-13 HISTORY — DX: Unspecified abnormal cytological findings in specimens from vagina: R87.629

## 2014-07-13 HISTORY — DX: Gestational diabetes mellitus in pregnancy, unspecified control: O24.419

## 2014-07-13 LAB — COMPREHENSIVE METABOLIC PANEL
ALT: 22 U/L (ref 0–35)
ANION GAP: 9 (ref 5–15)
AST: 26 U/L (ref 0–37)
Albumin: 3 g/dL — ABNORMAL LOW (ref 3.5–5.2)
Alkaline Phosphatase: 70 U/L (ref 39–117)
BUN: 8 mg/dL (ref 6–23)
CO2: 21 mmol/L (ref 19–32)
CREATININE: 0.48 mg/dL — AB (ref 0.50–1.10)
Calcium: 8.8 mg/dL (ref 8.4–10.5)
Chloride: 104 mmol/L (ref 96–112)
GFR calc Af Amer: >90 mL/min (ref >90)
GFR calc non Af Amer: >90 mL/min (ref >90)
Glucose, Bld: 87 mg/dL (ref 70–99)
POTASSIUM: 3.7 mmol/L (ref 3.5–5.1)
SODIUM: 134 mmol/L — AB (ref 135–145)
TOTAL PROTEIN: 7 g/dL (ref 6.0–8.3)
Total Bilirubin: 0.2 mg/dL — ABNORMAL LOW (ref 0.3–1.2)

## 2014-07-13 LAB — URINALYSIS, ROUTINE W REFLEX MICROSCOPIC
Bilirubin Urine: NEGATIVE
GLUCOSE, UA: NEGATIVE mg/dL
Ketones, ur: NEGATIVE mg/dL
Leukocytes, UA: NEGATIVE
Nitrite: NEGATIVE
Protein, ur: NEGATIVE mg/dL
Specific Gravity, Urine: <1.005 — ABNORMAL LOW (ref 1.005–1.030)
UROBILINOGEN UA: 0.2 mg/dL (ref 0.0–1.0)
pH: 5.5 (ref 5.0–8.0)

## 2014-07-13 LAB — CBC
HCT: 32.9 % — ABNORMAL LOW (ref 36.0–46.0)
Hemoglobin: 10.8 g/dL — ABNORMAL LOW (ref 12.0–15.0)
MCH: 27.6 pg (ref 26.0–34.0)
MCHC: 32.8 g/dL (ref 30.0–36.0)
MCV: 83.9 fL (ref 78.0–100.0)
Platelets: 282 10*3/uL (ref 150–400)
RBC: 3.92 MIL/uL (ref 3.87–5.11)
RDW: 14.9 % (ref 11.5–15.5)
WBC: 11.9 10*3/uL — ABNORMAL HIGH (ref 4.0–10.5)

## 2014-07-13 LAB — AMYLASE: AMYLASE: 83 U/L (ref 0–105)

## 2014-07-13 LAB — FETAL FIBRONECTIN: FETAL FIBRONECTIN: NEGATIVE

## 2014-07-13 LAB — URINE MICROSCOPIC-ADD ON

## 2014-07-13 LAB — LIPASE, BLOOD: LIPASE: 34 U/L (ref 11–59)

## 2014-07-13 MED ORDER — GI COCKTAIL ~~LOC~~
30.0000 mL | Freq: Once | ORAL | Status: AC
  Administered 2014-07-13: 30 mL via ORAL
  Filled 2014-07-13: qty 30

## 2014-07-13 MED ORDER — NIFEDIPINE 10 MG PO CAPS: 10.0000 mg | ORAL_CAPSULE | Freq: Four times a day (QID) | ORAL | Status: DC | PRN

## 2014-07-13 MED ORDER — NIFEDIPINE 10 MG PO CAPS
10.0000 mg | ORAL_CAPSULE | ORAL | Status: DC | PRN
  Filled 2014-07-13: qty 1

## 2014-07-13 MED ORDER — RANITIDINE HCL 150 MG PO TABS: 150.0000 mg | ORAL_TABLET | Freq: Two times a day (BID) | ORAL | Status: DC

## 2014-07-13 NOTE — MAU Note (Signed)
Been having abdominal and back pain since [redacted]wks gestation. Shortness of breath began this morning.  Pt feels like she is having contractions irregularly, 37 minutes apart this morning. Has hard knot in top of stomach. Denies bright red vaginal bleeding.Positive/increased fetal movement Denies vaginal discharge. Takes 17P injections for previous PTL in last pregnancy. Says she has shortening of her cervix.

## 2014-07-13 NOTE — MAU Provider Note (Signed)
History     CSN: [id number]  Arrival date and time: 07/13/14 1139   First Provider Initiated Contact with Patient 07/13/14 1244      Chief Complaint  Patient presents with  . Abdominal Pain  . Back Pain  . Shortness of Breath   HPI  [name] is a 39 y.o. G3P0020 at [redacted]w[redacted]d here with report of intermittent upper abdominal pain that started around 0700 today.  Also reports lower pelvic pain that radiates to the back.  Pain is described as constant.  Denies vaginal vaginal bleeding or leaking of fluid.    Also reports shortness of breath that started this AM.  Reports hard to catch breath due to pain in upper abdomen.  Denies chest pain, wheezing.  Patient states recently diagnosed with gestational diabetes, will begin checking blood glucose within the week.  History of fetal delivery at 15 wks secondary to PPROM.  Currently getting weekly 17p.  Reports shortened cervix this pregnancy.  +FFN two weeks ago, received BMZ.    Addendum Dorathy Kinsman, CNM) upon review of patient's prenatal records:  06/14/2014: Cervical length 2.8 cm 06/16/2014: Fetal fibronectin positive 06/22/14 and 06/23/14: BMZ 06/23/2014: Cervical length 3.46 cm  Past Medical History  Diagnosis Date  . Frequent headaches   . Anemia   . PCOS (polycystic ovarian syndrome)   . Depression   . Sciatic pain   . Preterm labor   . Hypertension   . Vaginal Pap smear, abnormal   . Gestational diabetes     Past Surgical History  Procedure Laterality Date  . Dilation and curettage of uterus    . Colposcopy      Family History  Problem Relation Age of Onset  . Hypertension Mother   . Heart disease Mother   . Hyperlipidemia Mother   . Heart disease Father   . Stomach cancer Father        . Hypertension Father   . Cancer Sister   . Diabetes Maternal Uncle   . Diabetes Paternal Aunt   . Thyroid disease Other   . Migraines Brother   . Migraines Mother     History  Substance Use Topics  . Smoking  status: Former Smoker    Quit date: 01/17/2014  . Smokeless tobacco: Former Neurosurgeon    Quit date: 01/17/2014  . Alcohol Use: No     Comment: ocassionally- 2 per year    Allergies:  Allergies  Allergen Reactions  . Lortab [Hydrocodone-Acetaminophen] Itching    Prescriptions prior to admission  Medication Sig Dispense Refill Last Dose  . cyclobenzaprine (FLEXERIL) 10 MG tablet Take 10 mg by mouth 3 (three) times daily as needed for muscle spasms.   Past Week at Unknown time  . hydroxyprogesterone caproate (DELALUTIN) 250 mg/mL OIL injection Inject 250 mg into the muscle once a week.   Past Week at Unknown time  . labetalol (NORMODYNE) 100 MG tablet Take 1 tablet (100 mg total) by mouth 2 (two) times daily. (Patient taking differently: Take 100 mg by mouth once. ) 60 tablet 3 06/22/2014 at Unknown time  . mometasone (NASONEX) 50 MCG/ACT nasal spray Place 2 sprays into the nose daily. 17 g 12 Taking  . NIFEdipine (PROCARDIA) 10 MG capsule Take 1 capsule (10 mg total) by mouth every 6 (six) hours as needed. 30 capsule 1   . prenatal vitamin w/FE, FA (PRENATAL 1 + 1) 27-1 MG TABS tablet Take 1 tablet by mouth daily at 12 noon.   06/21/2014  at Unknown time  . promethazine (PHENERGAN) 25 MG tablet Take 1 tablet (25 mg total) by mouth every 6 (six) hours as needed for nausea or vomiting. 30 tablet 0 Past Week at Unknown time  . Simethicone (GAS-X PO) Take 1 tablet by mouth daily as needed.   Past Week at Unknown time    Review of Systems  Constitutional: Negative for fever and chills.  Respiratory: Positive for shortness of breath. Negative for cough, hemoptysis, sputum production and wheezing.   Cardiovascular: Negative for chest pain, palpitations and leg swelling.  Gastrointestinal: Positive for heartburn and abdominal pain. Negative for nausea, vomiting, diarrhea, constipation and blood in stool.  Genitourinary: Negative for dysuria, urgency, frequency, hematuria and flank pain.       Negative  for vaginal bleeding, LOF or vaginal discharge  Musculoskeletal: Negative for back pain.   Physical Exam   Blood pressure 132/90, pulse 108, temperature 97.6 F (36.4 C), temperature source Oral, resp. rate 20, last menstrual period 12/26/2013, SpO2 98 %, unknown if currently breastfeeding.  Physical Exam  Nursing note and vitals reviewed. Constitutional: She appears well-developed and well-nourished. No distress.  HENT:  Head: Normocephalic.  Eyes: Conjunctivae are normal.  Cardiovascular: Normal rate, regular rhythm and normal heart sounds.   Respiratory: Effort normal and breath sounds normal. No respiratory distress. She has no wheezes. She has no rales.  GI: Soft. Bowel sounds are normal. She exhibits no distension. There is no tenderness. There is no guarding.  Genitourinary: Vagina normal. No vaginal discharge found.  Skin: She is not diaphoretic.     Results for orders placed or performed during the hospital encounter of 07/13/14 (from the past 336 hour(s))  Urinalysis, Routine w reflex microscopic   Collection Time: 07/13/14 11:42 AM  Result Value Ref Range   Color, Urine YELLOW YELLOW   APPearance CLEAR CLEAR   Specific Gravity, Urine <1.005 (L) 1.005 - 1.030   pH 5.5 5.0 - 8.0   Glucose, UA NEGATIVE NEGATIVE mg/dL   Hgb urine dipstick TRACE (A) NEGATIVE   Bilirubin Urine NEGATIVE NEGATIVE   Ketones, ur NEGATIVE NEGATIVE mg/dL   Protein, ur NEGATIVE NEGATIVE mg/dL   Urobilinogen, UA 0.2 0.0 - 1.0 mg/dL   Nitrite NEGATIVE NEGATIVE   Leukocytes, UA NEGATIVE NEGATIVE  Urine microscopic-add on   Collection Time: 07/13/14 11:42 AM  Result Value Ref Range   Squamous Epithelial / LPF RARE RARE   RBC / HPF 0-2 <3 RBC/hpf  Comprehensive metabolic panel   Collection Time: 07/13/14  1:45 PM  Result Value Ref Range   Sodium 134 (L) 135 - 145 mmol/L   Potassium 3.7 3.5 - 5.1 mmol/L   Chloride 104 96 - 112 mmol/L   CO2 21 19 - 32 mmol/L   Glucose, Bld 87 70 - 99 mg/dL    BUN 8 6 - 23 mg/dL   Creatinine, Ser 5.28 (L) 0.50 - 1.10 mg/dL   Calcium 8.8 8.4 - 41.3 mg/dL   Total Protein 7.0 6.0 - 8.3 g/dL   Albumin 3.0 (L) 3.5 - 5.2 g/dL   AST 26 0 - 37 U/L   ALT 22 0 - 35 U/L   Alkaline Phosphatase 70 39 - 117 U/L   Total Bilirubin 0.2 (L) 0.3 - 1.2 mg/dL   GFR calc non Af Amer >90 >90 mL/min   GFR calc Af Amer >90 >90 mL/min   Anion gap 9 5 - 15  CBC   Collection Time: 07/13/14  1:45 PM  Result  Value Ref Range   WBC 11.9 (H) 4.0 - 10.5 K/uL   RBC 3.92 3.87 - 5.11 MIL/uL   Hemoglobin 10.8 (L) 12.0 - 15.0 g/dL   HCT 62.132.9 (L) 30.836.0 - 65.746.0 %   MCV 83.9 78.0 - 100.0 fL   MCH 27.6 26.0 - 34.0 pg   MCHC 32.8 30.0 - 36.0 g/dL   RDW 84.614.9 96.211.5 - 95.215.5 %   Platelets 282 150 - 400 K/uL  Lipase, blood   Collection Time: 07/13/14  1:45 PM  Result Value Ref Range   Lipase 34 11 - 59 U/L  Amylase   Collection Time: 07/13/14  1:45 PM  Result Value Ref Range   Amylase 83 0 - 105 U/L  Fetal fibronectin   Collection Time: 07/13/14  2:20 PM  Result Value Ref Range   Fetal Fibronectin NEGATIVE NEGATIVE     MAU Course  Procedures  1315 Report given to V. Katrinka BlazingSmith who assumes care of patient.  Eino FarberWalidah Kennith GainN Karim, CNM   EFM: 150, mod variability, 10x10 acels. No decels Toco: No UC's  GI cocktail, Procardia, PO fluids  Epigastric discomfort, SOB resolved. No further UC's. Cervix long and closed.    Assessment and Plan   1. Pelvic pressure in pregnancy, antepartum, third trimester   2. History of preterm premature rupture of membranes (PROM) in previous pregnancy, currently pregnant, third trimester   3. Gastroesophageal reflux during pregnancy in third trimester, antepartum    PLAN: D/C home PTL precautions.  Discussed normal discomforts of pregnancy vs red flags.  Follow-up Information    Follow up with Levi AlandANDERSON,MARK E, MD.   Specialty:  Obstetrics and Gynecology   Why:  As scheduled   Contact information:   [address] [place]  [phone]       [facility] [place] [facility]   Why:  As needed in emergencies   Contact information:   [address] [id number] mc [place] [id number]        Medication List    STOP taking these medications        labetalol 100 MG tablet  Commonly known as:  NORMODYNE     mometasone 50 MCG/ACT nasal spray  Commonly known as:  NASONEX      TAKE these medications        cyclobenzaprine 10 MG tablet  Commonly known as:  FLEXERIL  Take 10 mg by mouth 3 (three) times daily as needed for muscle spasms.     GAS-X PO  Take 1 tablet by mouth daily as needed (gas pain).     hydroxyprogesterone caproate 250 mg/mL Oil injection  Commonly known as:  DELALUTIN  Inject 250 mg into the muscle once a week. On wednesdays     NIFEdipine 10 MG capsule  Commonly known as:  PROCARDIA  Take 1 capsule (10 mg total) by mouth every 6 (six) hours as needed (contractions).     prenatal vitamin w/FE, FA 27-1 MG Tabs tablet  Take 1 tablet by mouth daily at 12 noon.     promethazine 25 MG tablet  Commonly known as:  PHENERGAN  Take 1 tablet (25 mg total) by mouth every 6 (six) hours as needed for nausea or vomiting.     ranitidine 150 MG tablet  Commonly known as:  ZANTAC  Take 1 tablet (150 mg total) by mouth 2 (two) times daily.      Rx GI cocktail  per pt request. Only thing that has worked.   [place], CNM 07/13/2014 4:04 PM

## 2014-07-13 NOTE — MAU Note (Signed)
Pt C/O feeling large knot at the top of her stomach, has a lot of tenderness & pain in that area - started this morning.  Also has lower abd pain that radiates all the way around the left side to her back.  Denies bleeding or LOF. Feels SOB.

## 2014-07-13 NOTE — MAU Note (Signed)
Pt says she feels relief from abdominal pain and is "ready to go home".

## 2014-07-13 NOTE — Discharge Instructions (Signed)
Abdominal Pain During Pregnancy °Abdominal pain is common in pregnancy. Most of the time, it does not cause harm. There are many causes of abdominal pain. Some causes are more serious than others. Some of the causes of abdominal pain in pregnancy are easily diagnosed. Occasionally, the diagnosis takes time to understand. Other times, the cause is not determined. Abdominal pain can be a sign that something is very wrong with the pregnancy, or the pain may have nothing to do with the pregnancy at all. For this reason, always tell your health care provider if you have any abdominal discomfort. °HOME CARE INSTRUCTIONS  °Monitor your abdominal pain for any changes. The following actions may help to alleviate any discomfort you are experiencing: °· Do not have sexual intercourse or put anything in your vagina until your symptoms go away completely. °· Get plenty of rest until your pain improves. °· Drink clear fluids if you feel nauseous. Avoid solid food as long as you are uncomfortable or nauseous. °· Only take over-the-counter or prescription medicine as directed by your health care provider. °· Keep all follow-up appointments with your health care provider. °SEEK IMMEDIATE MEDICAL CARE IF: °· You are bleeding, leaking fluid, or passing tissue from the vagina. °· You have increasing pain or cramping. °· You have persistent vomiting. °· You have painful or bloody urination. °· You have a fever. °· You notice a decrease in your baby's movements. °· You have extreme weakness or feel faint. °· You have shortness of breath, with or without abdominal pain. °· You develop a severe headache with abdominal pain. °· You have abnormal vaginal discharge with abdominal pain. °· You have persistent diarrhea. °· You have abdominal pain that continues even after rest, or gets worse. °MAKE SURE YOU:  °· Understand these instructions. °· Will watch your condition. °· Will get help right away if you are not doing well or get  worse. °Document Released: 03/05/2005 Document Revised: 12/24/2012 Document Reviewed: 10/02/2012 °ExitCare® Patient Information ©2015 [employer]. This information is not intended to replace advice given to you by your health care provider. Make sure you discuss any questions you have with your health care provider. ° °Heartburn During Pregnancy  °Heartburn is a burning sensation in the chest caused by stomach acid backing up into the esophagus. Heartburn is common in pregnancy because a certain hormone (progesterone) is released when a woman is pregnant. The progesterone hormone may relax the valve that separates the esophagus from the stomach. This allows acid to go up into the esophagus, causing heartburn. Heartburn may also happen in pregnancy because the enlarging uterus pushes up on the stomach, which pushes more acid into the esophagus. This is especially true in the later stages of pregnancy. Heartburn problems usually go away after giving birth. °CAUSES  °Heartburn is caused by stomach acid backing up into the esophagus. During pregnancy, this may result from various things, including:  °· The progesterone hormone. °· Changing hormone levels. °· The growing uterus pushing stomach acid upward. °· Large meals. °· Certain foods and drinks. °· Exercise. °· Increased acid production. °SIGNS AND SYMPTOMS  °· Burning pain in the chest or lower throat. °· Bitter taste in the mouth. °· Coughing. °DIAGNOSIS  °Your health care provider will typically diagnose heartburn by taking a careful history of your concern. Blood tests may be done to check for a certain type of bacteria that is associated with heartburn. Sometimes, heartburn is diagnosed by prescribing a heartburn medicine to see if the symptoms   improve. In some cases, a procedure called an endoscopy may be done. In this procedure, a tube with a light and a camera on the end (endoscope) is used to examine the esophagus and the stomach. TREATMENT  Treatment  will vary depending on the severity of your symptoms. Your health care provider may recommend:  Over-the-counter medicines (antacids, acid reducers) for mild heartburn.  Prescription medicines to decrease stomach acid or to protect your stomach lining.  Certain changes in your diet.  Elevating the head of your bed by putting blocks under the legs. This helps prevent stomach acid from backing up into the esophagus when you are lying down. HOME CARE INSTRUCTIONS   Only take over-the-counter or prescription medicines as directed by your health care provider.  Raise the head of your bed by putting blocks under the legs if instructed to do so by your health care provider. Sleeping with more pillows is not effective because it only changes the position of your head.  Do not exercise right after eating.  Avoid eating 2-3 hours before bed. Do not lie down right after eating.  Eat small meals throughout the day instead of three large meals.  Identify foods and beverages that make your symptoms worse and avoid them. Foods you may want to avoid include:  Peppers.  Chocolate.  High-fat foods, including fried foods.  Spicy foods.  Garlic and onions.  Citrus fruits, including oranges, grapefruit, lemons, and limes.  Food containing tomatoes or tomato products.  Mint.  Carbonated and caffeinated drinks.  Vinegar. SEEK MEDICAL CARE IF:  You have abdominal pain of any kind.  You feel burning in your upper abdomen or chest, especially after eating or lying down.  You have nausea and vomiting.  Your stomach feels upset after you eat. SEEK IMMEDIATE MEDICAL CARE IF:   You have severe chest pain that goes down your arm or into your jaw or neck.  You feel sweaty, dizzy, or light-headed.  You become short of breath.  You vomit blood.  You have difficulty or pain with swallowing.  You have bloody or black, tarry stools.  You have episodes of heartburn more than 3 times a  week, for more than 2 weeks. MAKE SURE YOU:  Understand these instructions.  Will watch your condition.  Will get help right away if you are not doing well or get worse. Document Released: 03/02/2000 Document Revised: 03/10/2013 Document Reviewed: 10/22/2012 [facility] Patient Information 2015 [place]. This information is not intended to replace advice given to you by your health care provider. Make sure you discuss any questions you have with your health care provider.  Food Choices for Gastroesophageal Reflux Disease When you have gastroesophageal reflux disease (GERD), the foods you eat and your eating habits are very important. Choosing the right foods can help ease the discomfort of GERD. WHAT GENERAL GUIDELINES DO I NEED TO FOLLOW?  Choose fruits, vegetables, whole grains, low-fat dairy products, and low-fat meat, fish, and poultry.  Limit fats such as oils, salad dressings, butter, nuts, and avocado.  Keep a food diary to identify foods that cause symptoms.  Avoid foods that cause reflux. These may be different for different people.  Eat frequent small meals instead of three large meals each day.  Eat your meals slowly, in a relaxed setting.  Limit fried foods.  Cook foods using methods other than frying.  Avoid drinking alcohol.  Avoid drinking large amounts of liquids with your meals.  Avoid bending over or lying down  until 2-3 hours after eating. WHAT FOODS ARE NOT RECOMMENDED? The following are some foods and drinks that may worsen your symptoms: Vegetables Tomatoes. Tomato juice. Tomato and spaghetti sauce. Chili peppers. Onion and garlic. Horseradish. Fruits Oranges, grapefruit, and lemon (fruit and juice). Meats High-fat meats, fish, and poultry. This includes hot dogs, ribs, ham, sausage, salami, and bacon. Dairy Whole milk and chocolate milk. Sour cream. Cream. Butter. Ice cream. Cream cheese.  Beverages Coffee and tea, with or without caffeine.  Carbonated beverages or energy drinks. Condiments Hot sauce. Barbecue sauce.  Sweets/Desserts Chocolate and cocoa. Donuts. Peppermint and spearmint. Fats and Oils High-fat foods, including [place] fries and potato chips. Other Vinegar. Strong spices, such as black pepper, white pepper, red pepper, cayenne, curry powder, cloves, ginger, and chili powder. The items listed above may not be a complete list of foods and beverages to avoid. Contact your dietitian for more information. Document Released: 03/05/2005 Document Revised: 03/10/2013 Document Reviewed: 01/07/2013 [facility] Patient Information 2015 [place], [place]. This information is not intended to replace advice given to you by your health care provider. Make sure you discuss any questions you have with your health care provider.

## 2014-07-14 ENCOUNTER — Encounter: Payer: 59 | Attending: Obstetrics & Gynecology

## 2014-07-14 VITALS — Ht 71.0 in | Wt 222.5 lb

## 2014-07-14 DIAGNOSIS — Z713 Dietary counseling and surveillance: Secondary | ICD-10-CM | POA: Diagnosis not present

## 2014-07-14 DIAGNOSIS — R7309 Other abnormal glucose: Secondary | ICD-10-CM

## 2014-07-14 DIAGNOSIS — R7302 Impaired glucose tolerance (oral): Secondary | ICD-10-CM | POA: Diagnosis present

## 2014-07-14 NOTE — Progress Notes (Signed)
  Patient was seen on 07/14/14 for Gestational Diabetes self-management class at the [facility]. The following learning objectives were met by the patient during this course:   States the definition of Gestational Diabetes  States why dietary management is important in controlling blood glucose  Describes the effects each nutrient has on blood glucose levels  Demonstrates ability to create a balanced meal plan  Demonstrates carbohydrate counting   States when to check blood glucose levels  Demonstrates proper blood glucose monitoring techniques  States the effect of stress and exercise on blood glucose levels  States the importance of limiting caffeine and abstaining from alcohol and smoking  Blood glucose monitor given: Accu Chek Nano BG Monitoring Kit Lot # [id number] Exp: 03/19/15 Blood glucose reading: 86 mg/dl  Patient instructed to monitor glucose levels: FBS: 60 - <90 1 hour: <140 2 hour: <120  *Patient received handouts:  Nutrition Diabetes and Pregnancy  Carbohydrate Counting List  Patient will be seen for follow-up as needed.

## 2014-07-14 NOTE — Addendum Note (Signed)
Addended by: Vevelyn RoyalsPADDOCK, Holman Bonsignore W on: 07/14/2014 12:03 PM   Modules accepted: Orders, Medications

## 2014-08-01 ENCOUNTER — Inpatient Hospital Stay (HOSPITAL_COMMUNITY)
Admission: AD | Admit: 2014-08-01 | Discharge: 2014-08-01 | Disposition: A | Discharge status: Home / Self Care | Payer: 59 | Source: Ambulatory Visit | Attending: Obstetrics and Gynecology | Admitting: Obstetrics and Gynecology

## 2014-08-01 ENCOUNTER — Encounter (HOSPITAL_COMMUNITY): Payer: Self-pay | Admitting: *Deleted

## 2014-08-01 DIAGNOSIS — Y92009 Unspecified place in unspecified non-institutional (private) residence as the place of occurrence of the external cause: Secondary | ICD-10-CM

## 2014-08-01 DIAGNOSIS — W010XXA Fall on same level from slipping, tripping and stumbling without subsequent striking against object, initial encounter: Secondary | ICD-10-CM | POA: Insufficient documentation

## 2014-08-01 DIAGNOSIS — W19XXXA Unspecified fall, initial encounter: Secondary | ICD-10-CM | POA: Diagnosis not present

## 2014-08-01 DIAGNOSIS — Y9389 Activity, other specified: Secondary | ICD-10-CM | POA: Diagnosis not present

## 2014-08-01 DIAGNOSIS — Z87891 Personal history of nicotine dependence: Secondary | ICD-10-CM | POA: Diagnosis not present

## 2014-08-01 DIAGNOSIS — M25552 Pain in left hip: Secondary | ICD-10-CM | POA: Insufficient documentation

## 2014-08-01 DIAGNOSIS — Y929 Unspecified place or not applicable: Secondary | ICD-10-CM

## 2014-08-01 DIAGNOSIS — O26893 Other specified pregnancy related conditions, third trimester: Secondary | ICD-10-CM | POA: Diagnosis not present

## 2014-08-01 DIAGNOSIS — O9989 Other specified diseases and conditions complicating pregnancy, childbirth and the puerperium: Secondary | ICD-10-CM | POA: Insufficient documentation

## 2014-08-01 DIAGNOSIS — Z3689 Encounter for other specified antenatal screening: Secondary | ICD-10-CM

## 2014-08-01 DIAGNOSIS — Y999 Unspecified external cause status: Secondary | ICD-10-CM

## 2014-08-01 DIAGNOSIS — Z3A31 31 weeks gestation of pregnancy: Secondary | ICD-10-CM | POA: Diagnosis not present

## 2014-08-01 NOTE — MAU Note (Signed)
Pt stated she slipped and fell on her bottom and her back . C/o  Left hip pain stated it was starting to swell. reports good fetal movement denies hitting her stomach.

## 2014-08-01 NOTE — MAU Provider Note (Signed)
History     CSN: [id number]  Arrival date and time: 08/01/14 0040   None     Chief Complaint  Patient presents with  . Fall   HPI  39 y.o. G3P0020 at 3467w1d s/p fall at home, fell onto L hip/buttock, discomfort in hip now w/ some swelling. Pt denies any abdominal impact, no abd pain, no vaginal bleeding or LOF, + fetal movement. Pregnancy complicated by preterm contractions, has had BMZ x2, no preterm labor related complaints tonight.     Past Medical History  Diagnosis Date  . Frequent headaches   . Anemia   . PCOS (polycystic ovarian syndrome)   . Depression   . Sciatic pain   . Preterm labor   . Hypertension   . Vaginal Pap smear, abnormal   . Gestational diabetes     Past Surgical History  Procedure Laterality Date  . Dilation and curettage of uterus    . Colposcopy      Family History  Problem Relation Age of Onset  . Hypertension Mother   . Heart disease Mother   . Hyperlipidemia Mother   . Heart disease Father   . Stomach cancer Father        . Hypertension Father   . Cancer Sister   . Diabetes Maternal Uncle   . Diabetes Paternal Aunt   . Thyroid disease Other   . Migraines Brother   . Migraines Mother     History  Substance Use Topics  . Smoking status: Former Smoker    Quit date: 01/17/2014  . Smokeless tobacco: Former NeurosurgeonUser    Quit date: 01/17/2014  . Alcohol Use: No     Comment: ocassionally- 2 per year    Allergies:  Allergies  Allergen Reactions  . Lortab [Hydrocodone-Acetaminophen] Itching    Prescriptions prior to admission  Medication Sig Dispense Refill Last Dose  . cyclobenzaprine (FLEXERIL) 10 MG tablet Take 10 mg by mouth 3 (three) times daily as needed for muscle spasms.   Taking  . hydroxyprogesterone caproate (DELALUTIN) 250 mg/mL OIL injection Inject 250 mg into the muscle once a week. On wednesdays   Taking  . NIFEdipine (PROCARDIA) 10 MG capsule Take 1 capsule (10 mg total) by mouth every 6 (six) hours as needed  (contractions). 30 capsule 1 Taking  . prenatal vitamin w/FE, FA (PRENATAL 1 + 1) 27-1 MG TABS tablet Take 1 tablet by mouth daily at 12 noon.   Taking  . promethazine (PHENERGAN) 25 MG tablet Take 1 tablet (25 mg total) by mouth every 6 (six) hours as needed for nausea or vomiting. 30 tablet 0 Taking  . ranitidine (ZANTAC) 150 MG tablet Take 1 tablet (150 mg total) by mouth 2 (two) times daily. 60 tablet 2 Unknown  . sertraline (ZOLOFT) 50 MG tablet Take 50 mg by mouth daily.   Taking  . Simethicone (GAS-X PO) Take 1 tablet by mouth daily as needed (gas pain).    Unknown    Review of Systems  Constitutional: Negative.   Respiratory: Negative.   Cardiovascular: Negative.   Gastrointestinal: Negative for nausea, vomiting, abdominal pain, diarrhea and constipation.  Genitourinary: Negative for dysuria, urgency, frequency, hematuria and flank pain.       Negative for vaginal bleeding, cramping/contractions  Musculoskeletal: Positive for joint pain and falls.  Neurological: Negative.   Psychiatric/Behavioral: Negative.    Physical Exam   Last menstrual period 12/26/2013, unknown if currently breastfeeding.  Physical Exam  Nursing note and vitals reviewed. Constitutional: She  is oriented to person, place, and time. She appears well-developed and well-nourished. No distress.  Cardiovascular: Normal rate.   Respiratory: Effort normal.  GI: Soft. Distention: c/w EGA. There is no tenderness. There is no rebound and no guarding.  Musculoskeletal: Normal range of motion.  Neurological: She is alert and oriented to person, place, and time.  Skin: Skin is warm.  Psychiatric: She has a normal mood and affect.    MAU Course  Procedures  FHR: 120s bpm, variability: moderate,  accelerations:  Present,  decelerations:  Absent TOCO: some irritability initially, then quiet   Assessment and Plan   1. Fall at home, initial encounter   2. NST (non-stress test) reactive   Fall w/ no abdominal  impact, reactive tracing, f/u as scheduled or sooner PRN, precautions rev'd    Medication List    STOP taking these medications        GAS-X PO     promethazine 25 MG tablet  Commonly known as:  PHENERGAN     ranitidine 150 MG tablet  Commonly known as:  ZANTAC     sertraline 50 MG tablet  Commonly known as:  ZOLOFT      TAKE these medications        cyclobenzaprine 10 MG tablet  Commonly known as:  FLEXERIL  Take 10 mg by mouth 3 (three) times daily as needed for muscle spasms.     ferrous sulfate 325 (65 FE) MG tablet  Take 325 mg by mouth daily with breakfast.     hydroxyprogesterone caproate 250 mg/mL Oil injection  Commonly known as:  DELALUTIN  Inject 250 mg into the muscle once a week. On wednesdays     NIFEdipine 10 MG capsule  Commonly known as:  PROCARDIA  Take 1 capsule (10 mg total) by mouth every 6 (six) hours as needed (contractions).     prenatal vitamin w/FE, FA 27-1 MG Tabs tablet  Take 1 tablet by mouth daily at 12 noon.            Follow-up Information    Follow up with Mauri Temkin A, MD.   Specialty:  Obstetrics and Gynecology   Why:  as scheduled or sooner as needed   Contact information:   [address] [place] [id number] [phone]         FRAZIER,NATALIE 08/01/2014, 12:54 AM

## 2014-08-30 ENCOUNTER — Other Ambulatory Visit: Payer: Self-pay | Admitting: Obstetrics & Gynecology

## 2014-08-30 LAB — OB RESULTS CONSOLE GBS: STREP GROUP B AG: NEGATIVE

## 2014-09-22 ENCOUNTER — Telehealth (HOSPITAL_COMMUNITY): Payer: Self-pay | Admitting: *Deleted

## 2014-09-22 ENCOUNTER — Encounter (HOSPITAL_COMMUNITY): Payer: Self-pay | Admitting: *Deleted

## 2014-09-22 NOTE — Telephone Encounter (Signed)
Preadmission screen  

## 2014-09-23 ENCOUNTER — Telehealth (HOSPITAL_COMMUNITY): Payer: Self-pay | Admitting: *Deleted

## 2014-09-23 NOTE — Telephone Encounter (Signed)
Preadmission screen  

## 2014-09-27 ENCOUNTER — Encounter (HOSPITAL_COMMUNITY): Payer: Self-pay

## 2014-09-27 ENCOUNTER — Inpatient Hospital Stay (HOSPITAL_COMMUNITY): Payer: 59 | Admitting: Anesthesiology

## 2014-09-27 ENCOUNTER — Inpatient Hospital Stay (HOSPITAL_COMMUNITY)
Admission: RE | Admit: 2014-09-27 | Discharge: 2014-09-29 | DRG: 775 | Disposition: A | Discharge status: Home / Self Care | Payer: 59 | Source: Ambulatory Visit | Attending: Obstetrics and Gynecology | Admitting: Obstetrics and Gynecology

## 2014-09-27 ENCOUNTER — Inpatient Hospital Stay (HOSPITAL_COMMUNITY): Admission: RE | Admit: 2014-09-27 | Payer: 59 | Source: Ambulatory Visit

## 2014-09-27 DIAGNOSIS — O24429 Gestational diabetes mellitus in childbirth, unspecified control: Principal | ICD-10-CM | POA: Diagnosis present

## 2014-09-27 DIAGNOSIS — Z87891 Personal history of nicotine dependence: Secondary | ICD-10-CM | POA: Diagnosis not present

## 2014-09-27 DIAGNOSIS — O09523 Supervision of elderly multigravida, third trimester: Secondary | ICD-10-CM

## 2014-09-27 DIAGNOSIS — Z3490 Encounter for supervision of normal pregnancy, unspecified, unspecified trimester: Secondary | ICD-10-CM

## 2014-09-27 DIAGNOSIS — Z79899 Other long term (current) drug therapy: Secondary | ICD-10-CM

## 2014-09-27 DIAGNOSIS — O24419 Gestational diabetes mellitus in pregnancy, unspecified control: Secondary | ICD-10-CM | POA: Diagnosis present

## 2014-09-27 DIAGNOSIS — Z3A39 39 weeks gestation of pregnancy: Secondary | ICD-10-CM | POA: Diagnosis present

## 2014-09-27 LAB — TYPE AND SCREEN
ABO/RH(D): O POS
Antibody Screen: NEGATIVE

## 2014-09-27 LAB — GLUCOSE, CAPILLARY
GLUCOSE-CAPILLARY: 128 mg/dL — AB (ref 65–99)
GLUCOSE-CAPILLARY: 124 mg/dL — AB (ref 65–99)
Glucose-Capillary: 77 mg/dL (ref 65–99)
Glucose-Capillary: 68 mg/dL (ref 65–99)
Glucose-Capillary: 68 mg/dL (ref 65–99)
Glucose-Capillary: 90 mg/dL (ref 65–99)

## 2014-09-27 LAB — RPR: RPR Ser Ql: NONREACTIVE

## 2014-09-27 LAB — GLUCOSE, RANDOM: GLUCOSE: 90 mg/dL (ref 65–99)

## 2014-09-27 LAB — CBC
HCT: 34.4 % — ABNORMAL LOW (ref 36.0–46.0)
HEMOGLOBIN: 11.4 g/dL — AB (ref 12.0–15.0)
MCH: 27.5 pg (ref 26.0–34.0)
MCHC: 33.1 g/dL (ref 30.0–36.0)
MCV: 82.9 fL (ref 78.0–100.0)
PLATELETS: 268 10*3/uL (ref 150–400)
RBC: 4.15 MIL/uL (ref 3.87–5.11)
RDW: 16 % — AB (ref 11.5–15.5)
WBC: 9.6 10*3/uL (ref 4.0–10.5)

## 2014-09-27 MED ORDER — LACTATED RINGERS IV SOLN
INTRAVENOUS | Status: DC
  Administered 2014-09-27: 17:00:00 via INTRAVENOUS

## 2014-09-27 MED ORDER — CITRIC ACID-SODIUM CITRATE 334-500 MG/5ML PO SOLN: 30.0000 mL | ORAL | Status: DC | PRN

## 2014-09-27 MED ORDER — DIPHENHYDRAMINE HCL 50 MG/ML IJ SOLN: 12.5000 mg | INTRAMUSCULAR | Status: DC | PRN

## 2014-09-27 MED ORDER — FENTANYL 2.5 MCG/ML BUPIVACAINE 1/10 % EPIDURAL INFUSION (WH - ANES)
14.0000 mL/h | INTRAMUSCULAR | Status: DC | PRN
Start: 2014-09-27 — End: 2014-09-28
  Administered 2014-09-27 (×3): 14 mL/h via EPIDURAL
  Filled 2014-09-27 (×2): qty 125

## 2014-09-27 MED ORDER — TERBUTALINE SULFATE 1 MG/ML IJ SOLN
0.2500 mg | Freq: Once | INTRAMUSCULAR | Status: AC | PRN
Start: 2014-09-27 — End: 2014-09-27

## 2014-09-27 MED ORDER — ACETAMINOPHEN 325 MG PO TABS: 650.0000 mg | ORAL_TABLET | ORAL | Status: DC | PRN

## 2014-09-27 MED ORDER — OXYTOCIN 40 UNITS IN LACTATED RINGERS INFUSION - SIMPLE MED
1.0000 m[IU]/min | INTRAVENOUS | Status: DC
  Administered 2014-09-27: 2 m[IU]/min via INTRAVENOUS
  Filled 2014-09-27: qty 1000

## 2014-09-27 MED ORDER — LIDOCAINE HCL (PF) 1 % IJ SOLN
INTRAMUSCULAR | Status: DC | PRN
  Administered 2014-09-27 (×2): 4 mL

## 2014-09-27 MED ORDER — TERBUTALINE SULFATE 1 MG/ML IJ SOLN: 0.2500 mg | Freq: Once | INTRAMUSCULAR | Status: AC | PRN

## 2014-09-27 MED ORDER — OXYTOCIN BOLUS FROM INFUSION
500.0000 mL | INTRAVENOUS | Status: DC
  Administered 2014-09-27: 500 mL via INTRAVENOUS

## 2014-09-27 MED ORDER — LACTATED RINGERS IV SOLN
500.0000 mL | INTRAVENOUS | Status: DC | PRN
  Administered 2014-09-27 (×2): 500 mL via INTRAVENOUS

## 2014-09-27 MED ORDER — PHENYLEPHRINE 40 MCG/ML (10ML) SYRINGE FOR IV PUSH (FOR BLOOD PRESSURE SUPPORT)
80.0000 ug | PREFILLED_SYRINGE | INTRAVENOUS | Status: DC | PRN
  Filled 2014-09-27: qty 2
  Filled 2014-09-27: qty 20

## 2014-09-27 MED ORDER — LIDOCAINE HCL (PF) 1 % IJ SOLN
30.0000 mL | INTRAMUSCULAR | Status: DC | PRN
  Filled 2014-09-27: qty 30

## 2014-09-27 MED ORDER — BUTORPHANOL TARTRATE 1 MG/ML IJ SOLN: 1.0000 mg | INTRAMUSCULAR | Status: DC | PRN

## 2014-09-27 MED ORDER — OXYTOCIN 40 UNITS IN LACTATED RINGERS INFUSION - SIMPLE MED: 62.5000 mL/h | INTRAVENOUS | Status: DC

## 2014-09-27 MED ORDER — ONDANSETRON HCL 4 MG/2ML IJ SOLN: 4.0000 mg | Freq: Four times a day (QID) | INTRAMUSCULAR | Status: DC | PRN

## 2014-09-27 MED ORDER — EPHEDRINE 5 MG/ML INJ
10.0000 mg | INTRAVENOUS | Status: DC | PRN
  Filled 2014-09-27: qty 2

## 2014-09-27 MED ORDER — MISOPROSTOL 25 MCG QUARTER TABLET
25.0000 ug | ORAL_TABLET | ORAL | Status: DC | PRN
  Administered 2014-09-27 (×3): 25 ug via VAGINAL
  Filled 2014-09-27: qty 1
  Filled 2014-09-27 (×3): qty 0.25

## 2014-09-27 NOTE — Progress Notes (Signed)
Comfortable w epidural  EFM: cat 1 w accels, now w late decelerations over last 20 minutes Toco: q2-3 min SVE: 3/90/-2  Pt repositioned w IVF bolus and late decelerations resolved.  Variability remains moderate.  Will continue pitocin.  Still in latent labor

## 2014-09-27 NOTE — Anesthesia Procedure Notes (Signed)
Epidural Patient location during procedure: OB  Staffing Anesthesiologist: Jannie Doyle Performed by: anesthesiologist   Preanesthetic Checklist Completed: patient identified, site marked, surgical consent, pre-op evaluation, timeout performed, IV checked, risks and benefits discussed and monitors and equipment checked  Epidural Patient position: sitting Prep: site prepped and draped and DuraPrep Patient monitoring: continuous pulse ox and blood pressure Approach: midline Location: L3-L4 Injection technique: LOR saline  Needle:  Needle type: Tuohy  Needle gauge: 17 G Needle length: 9 cm and 9 Needle insertion depth: 7 cm Catheter type: closed end flexible Catheter size: 19 Gauge Catheter at skin depth: 12 cm Test dose: negative  Assessment Events: blood not aspirated, injection not painful, no injection resistance, negative IV test and no paresthesia  Additional Notes Patient identified. Risks/Benefits/Options discussed with patient including but not limited to bleeding, infection, nerve damage, paralysis, failed block, incomplete pain control, headache, blood pressure changes, nausea, vomiting, reactions to medication both or allergic, itching and postpartum back pain. Confirmed with bedside nurse the patient's most recent platelet count. Confirmed with patient that they are not currently taking any anticoagulation, have any bleeding history or any family history of bleeding disorders. Patient expressed understanding and wished to proceed. All questions were answered. Sterile technique was used throughout the entire procedure. Please see nursing notes for vital signs. Test dose was given through epidural catheter and negative prior to continuing to dose epidural or start infusion. Warning signs of high block given to the patient including shortness of breath, tingling/numbness in hands, complete motor block, or any concerning symptoms with instructions to call for help. Patient was  given instructions on fall risk and not to get out of bed. All questions and concerns addressed with instructions to call with any issues or inadequate analgesia.      

## 2014-09-27 NOTE — H&P (Signed)
39 y.o. G3P0020 @ 823w2d presents for IOL for GDMA2.  Otherwise has good fetal movement and no bleeding.  Pregnancy c/b: 1. AMA--NIPT screen low risk 2. Chronic migraines--s/p neuro eval w brain MRI this pregnancy 3. GDMA2--on glyburide qhs.  Most recent growth US on 6/16 --EFW 3122 gm (86%),  AC 94% 4. H/o PPROM at 3315 wks--s/p Makena injections this pregnancy  Past Medical History  Diagnosis Date  . Frequent headaches   . Anemia   . PCOS (polycystic ovarian syndrome)   . Depression   . Sciatic pain   . Preterm labor   . Hypertension   . Vaginal Pap smear, abnormal   . Gestational diabetes     Past Surgical History  Procedure Laterality Date  . Dilation and curettage of uterus    . Colposcopy      OB History  Gravida Para Term Preterm AB SAB TAB Ectopic Multiple Living  3    2 2         # Outcome Date GA Lbr Len/2nd Weight Sex Delivery Anes PTL Lv  3 Current           2 SAB 2012 4943w0d            Complications: Preterm premature rupture of membranes (PPROM) with onset of labor after 24 hours of rupture in second trimester, antepartum  1 SAB 1994              History   Social History  . Marital Status: Single    Spouse Name: N/A  . Number of Children: N/A  . Years of Education: N/A   Occupational History  . [employer] Other   Social History Main Topics  . Smoking status: Former Smoker    Quit date: 01/17/2014  . Smokeless tobacco: Former NeurosurgeonUser    Quit date: 01/17/2014  . Alcohol Use: No     Comment: ocassionally- 2 per year  . Drug Use: No  . Sexual Activity: Yes    Birth Control/ Protection: None   Other Topics Concern  . 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   Has a bachelor's degree.   Caffeine use: 2 cups of pepsi/day    Lortab    Prenatal Transfer Tool  Maternal Diabetes: Yes:  Diabetes Type:  Insulin/Medication controlled Genetic Screening: Normal Maternal Ultrasounds/Referrals: Normal Fetal Ultrasounds or other  Referrals:  None Maternal Substance Abuse:  No Significant Maternal Medications:  Meds include: Progesterone Significant Maternal Lab Results: Lab values include: Group B Strep negative  ABO, Rh: --/--/O POS (07/11 0035) Antibody: NEG (07/11 0035) Rubella:  Immune RPR: Nonreactive (01/13 0000)  HBsAg: Negative (01/13 0000)  HIV: Non-reactive (01/13 0000)  GBS: Negative (06/13 0000)    Other PNC: uncomplicated.    Filed Vitals:   09/27/14 0545  BP: 128/88  Pulse: 94  Temp: 98.2 F (36.8 C)  Resp: 18     General:  NAD Lungs: CTAB Cardiac: RRR Abdomen:  soft, gravid, EFW 9# Ex:  trace edema b/l SVE:  FT/90/high FHTs:  120s, mod var, + accels, no decels, Cat 1 Toco:  rare   A/P   39 y.o. 623w2d  G3P0020 presents with IOL for GDMA2 IOL: Additional dose of cytotec, then transition to pitocin/AROM prn.  Epidural upon maternal request GDMA2--q2h FSBS during labor.  EFW 3 weeks ago 3100 gm (6lb 14oz) 86%.  Would not expect EFW to be >4500gm at this time, but did discuss elective  cesarean delivery and pt declined. Reviewed potential for increased risk of shoulder dystocia and cesarean delivery with GDM, including potential for neonatal morbidity.  Will follow labor curve closely FSR/ vtx/ GBS neg  Rohn Fritsch GEFFEL Brylei Pedley

## 2014-09-27 NOTE — Anesthesia Preprocedure Evaluation (Signed)
Anesthesia Evaluation  Patient identified by MRN, date of birth, ID band Patient awake    Reviewed: Allergy & Precautions, NPO status , Patient's Chart, lab work & pertinent test results  History of Anesthesia Complications Negative for: history of anesthetic complications  Airway Mallampati: II  TM Distance: >3 FB Neck ROM: Full    Dental no notable dental hx. (+) Dental Advisory Given   Pulmonary former smoker,  breath sounds clear to auscultation  Pulmonary exam normal       Cardiovascular hypertension, Pt. on medications Normal cardiovascular examRhythm:Regular Rate:Normal     Neuro/Psych  Headaches, PSYCHIATRIC DISORDERS Anxiety Depression Sciatica    GI/Hepatic negative GI ROS, Neg liver ROS,   Endo/Other  diabetes  Renal/GU negative Renal ROS  negative genitourinary   Musculoskeletal negative musculoskeletal ROS (+)   Abdominal   Peds negative pediatric ROS (+)  Hematology  (+) anemia ,   Anesthesia Other Findings   Reproductive/Obstetrics (+) Pregnancy                             Anesthesia Physical Anesthesia Plan  ASA: II  Anesthesia Plan: Epidural   Post-op Pain Management:    Induction:   Airway Management Planned:   Additional Equipment:   Intra-op Plan:   Post-operative Plan:   Informed Consent: I have reviewed the patients History and Physical, chart, labs and discussed the procedure including the risks, benefits and alternatives for the proposed anesthesia with the patient or authorized representative who has indicated his/her understanding and acceptance.   Dental advisory given  Plan Discussed with: CRNA  Anesthesia Plan Comments:         Anesthesia Quick Evaluation

## 2014-09-28 ENCOUNTER — Encounter (HOSPITAL_COMMUNITY): Payer: Self-pay

## 2014-09-28 LAB — CBC
HEMATOCRIT: 32 % — AB (ref 36.0–46.0)
Hemoglobin: 10.5 g/dL — ABNORMAL LOW (ref 12.0–15.0)
MCH: 27.2 pg (ref 26.0–34.0)
MCHC: 32.8 g/dL (ref 30.0–36.0)
MCV: 82.9 fL (ref 78.0–100.0)
PLATELETS: 237 10*3/uL (ref 150–400)
RBC: 3.86 MIL/uL — ABNORMAL LOW (ref 3.87–5.11)
RDW: 16.2 % — ABNORMAL HIGH (ref 11.5–15.5)
WBC: 15.7 10*3/uL — ABNORMAL HIGH (ref 4.0–10.5)

## 2014-09-28 LAB — GLUCOSE, CAPILLARY: GLUCOSE-CAPILLARY: 105 mg/dL — AB (ref 65–99)

## 2014-09-28 MED ORDER — DIPHENHYDRAMINE HCL 25 MG PO CAPS: 25.0000 mg | ORAL_CAPSULE | Freq: Four times a day (QID) | ORAL | Status: DC | PRN

## 2014-09-28 MED ORDER — PRENATAL MULTIVITAMIN CH
1.0000 | ORAL_TABLET | Freq: Every day | ORAL | Status: DC
  Administered 2014-09-28 – 2014-09-29 (×2): 1 via ORAL
  Filled 2014-09-28 (×2): qty 1

## 2014-09-28 MED ORDER — OXYCODONE-ACETAMINOPHEN 5-325 MG PO TABS: 1.0000 | ORAL_TABLET | ORAL | Status: DC | PRN

## 2014-09-28 MED ORDER — LANOLIN HYDROUS EX OINT: TOPICAL_OINTMENT | CUTANEOUS | Status: DC | PRN

## 2014-09-28 MED ORDER — TETANUS-DIPHTH-ACELL PERTUSSIS 5-2.5-18.5 LF-MCG/0.5 IM SUSP: 0.5000 mL | Freq: Once | INTRAMUSCULAR | Status: DC

## 2014-09-28 MED ORDER — BENZOCAINE-MENTHOL 20-0.5 % EX AERO
1.0000 | INHALATION_SPRAY | CUTANEOUS | Status: DC | PRN
Start: 2014-09-28 — End: 2014-09-29
  Administered 2014-09-28: 1 via TOPICAL
  Filled 2014-09-28: qty 56

## 2014-09-28 MED ORDER — SENNOSIDES-DOCUSATE SODIUM 8.6-50 MG PO TABS
2.0000 | ORAL_TABLET | ORAL | Status: DC
  Administered 2014-09-29: 2 via ORAL
  Filled 2014-09-28: qty 2

## 2014-09-28 MED ORDER — ONDANSETRON HCL 4 MG PO TABS: 4.0000 mg | ORAL_TABLET | ORAL | Status: DC | PRN

## 2014-09-28 MED ORDER — DIBUCAINE 1 % RE OINT
1.0000 "application " | TOPICAL_OINTMENT | RECTAL | Status: DC | PRN
  Administered 2014-09-28: 1 via RECTAL
  Filled 2014-09-28: qty 28

## 2014-09-28 MED ORDER — ONDANSETRON HCL 4 MG/2ML IJ SOLN: 4.0000 mg | INTRAMUSCULAR | Status: DC | PRN

## 2014-09-28 MED ORDER — ACETAMINOPHEN 325 MG PO TABS: 650.0000 mg | ORAL_TABLET | ORAL | Status: DC | PRN

## 2014-09-28 MED ORDER — SIMETHICONE 80 MG PO CHEW: 80.0000 mg | CHEWABLE_TABLET | ORAL | Status: DC | PRN

## 2014-09-28 MED ORDER — WITCH HAZEL-GLYCERIN EX PADS
1.0000 "application " | MEDICATED_PAD | CUTANEOUS | Status: DC | PRN
  Administered 2014-09-28: 1 via TOPICAL

## 2014-09-28 MED ORDER — IBUPROFEN 600 MG PO TABS
600.0000 mg | ORAL_TABLET | Freq: Four times a day (QID) | ORAL | Status: DC
  Administered 2014-09-28 – 2014-09-29 (×6): 600 mg via ORAL
  Filled 2014-09-28 (×6): qty 1

## 2014-09-28 MED ORDER — OXYCODONE-ACETAMINOPHEN 5-325 MG PO TABS: 2.0000 | ORAL_TABLET | ORAL | Status: DC | PRN

## 2014-09-28 NOTE — Progress Notes (Signed)
CLINICAL SOCIAL WORK MATERNAL/CHILD NOTE  Patient Details  Name: Alexandra [name] MRN: [id number] Date of Birth: 09/27/2014  Date:  09/28/2014  Clinical Social Worker Initiating Note:  Reyana Leisey, LCSW Date/ Time Initiated:  09/28/14/1345     Child's Name:  Alexandra [name]   Legal Guardian:  Alexandra [name] (mother) and Alexandra (father)  Need for Interpreter:  None   Date of Referral:  09/27/14     Reason for Referral:  History of depression and anxiety during the pregnancy  Referral Source:  Central Nursery   Household Members:  Significant Other   Natural Supports (not living in the home):  Immediate Family, Extended Family, Friends   Professional Supports: Therapist, OB  Employment: Full-time   Type of Work: insurance claims at [employer]   Education:    N/A  Financial Resources:  Private Insurance   Other Resources:    N/A  Cultural/Religious Considerations Which May Impact Care:  None reported  Strengths:  Home prepared for child , Pediatrician chosen , Ability to meet basic needs    Risk Factors/Current Problems:   1)Mental Health Concerns: MOB presents with history of depression and anxiety during the pregnancy secondary to fears/worries as she has a history of miscarriage. MOB stated that she initiated therapy and continued until she was placed on bedrest. MOB expressed goal of continuing therapy postpartum.  MOB was recommended Zoloft during the pregnancy, but MOB only took medication for 2 days. She does not have plans to start medication at this time, but acknowledges that Zoloft may be an option postpartum if she notes ongoing symptoms.    Cognitive State:  Able to Concentrate , Alert , Linear Thinking , Goal Oriented , Insightful    Mood/Affect:  Bright , Happy , Interested , Calm    CSW Assessment:  CSW received request for consult due to MOB presenting with a history of depression.  MOB presented as easily engaged and receptive to  the visit. She was in a pleasant mood and displayed a full range in affect. MOB smiled as she reflected upon becoming a mother and how she feels as she transitions to the postpartum period. MOB reported that she is celebrating the arrival of this infant as she had been wanting to be a mother for a long time, and she "finally" became a mother a day before her 39th birthday.  MOB did not present with any acute mental health symptoms, but presents with insight and an awareness of how to address mental health concerns as they arise.   MOB continues to report adjusting to her "new normal" of becoming a mother, but discussed that it has been a much anticipated adjustment. She smiled and reported that she is looking forward to returning home.  MOB discussed belief that she has a "very strong" support system that will continue to be present for her as she transitions to postpartum.  MOB discussed difficult pregnancy due to physical needs, bedrest, and emotional health needs.  MOB expressed gratitude that she is no longer pregnancy, and discussed belief that she was able to cope with all of these challenges due to the FOB's ongoing support.    MOB denied mental health history prior to this pregnancy. She stated that she was originally informed that she has anxiety since she was nervous and worried about the health of this infant due to her previous miscarriage.  MOB discussed how she often had a difficult time leaving the home out of fear that she would   fall and have a miscarriage.  MOB shared that she was [facility] a [facility] who shared impressions that MOB may have depression due to frequently crying and desire to isolate.  MOB stated that she was engaging in therapy until she was on bed rest, but discussed that the psychologist continued to call her and check in on her mental health.  MOB stated that Zoloft was recommended and that she took the medication for 2 days.  Per MOB, at the time, she  only took the medication for 2 days since she did not feel that it was working. MOB recognizes now that she did not allow the medication enough time to receive benefit.    As MOB transitions to the postpartum period, she denied current symptoms of depression and anxiety.  CSW explored normative anxieties for first time mothers, but MOB shared that she feels calm and comfortable providing infant care.  MOB discussed belief that her anxiety has been significantly reduced due to her ability to see the infant and to know that he is healthy.  MOB stated that she intends to follow up with her therapist postpartum, and she stated that she has "no issue" talking to people with how she feels. MOB expressed intention to continue to maintain an open dialogue with her OB about her feelings.  MOB acknowledges that medication may be recommended if symptoms of postpartum depression/anxiety are present, and she indicated increased interest now that she is no longer pregnant due to decreased risk factors for the infant and since she has fewer medications to take.  CSW reviewed additional non-clinical interventions that support maternal mental health.    MOB smiled and expressed appreciation for the visit and support.  MOB agreed to contact the CSW if needs arise during the admission.   CSW Plan/Description:   1)Patient/Family Education: Postpartum depression and anxiety 2)No Further Intervention Required/No Barriers to Discharge    Kelby FamVenning, Damany Eastman N, LCSW 09/28/2014, 2:41 PM

## 2014-09-28 NOTE — Lactation Note (Signed)
This note was copied from the chart of [name]. Lactation Consultation Note Initial visit at 17 hours of age.  Mom holding baby STS now, latched in cross cradle hold and denies pain.  Baby has strong rhythmic burst with wide flanged lips.  Mom reports history of PCOS with regular cycles the past 2 years.  Mom has had 2 miscarriages and anxiety related to this during her pregnancy causing mom to be on bedrest.  Mom reports good breast changes during pregnancy and breast appear to be WNL and well developed.  Baby maintained feeding for over 15 minutes and then released nipple and laid on mom STS sleepy.  [facility] resources given and discussed.  Encouraged to feed with early cues on demand.  Early newborn behavior discussed.  Hand expression reported by mom with colostrum visible.  Encouraged mom she is doing well with breastfeeding.   Mom to call for assist as needed.       Patient Name: [name] ZOXWR'UToday's Date: 09/28/2014 Reason for consult: Initial assessment   Maternal Data Has patient been taught Hand Expression?: Yes Does the patient have breastfeeding experience prior to this delivery?: No  Feeding Feeding Type: Breast Fed Length of feed: 15 min  LATCH Score/Interventions Latch: Grasps breast easily, tongue down, lips flanged, rhythmical sucking.  Audible Swallowing: A few with stimulation Intervention(s): Skin to skin;Hand expression  Type of Nipple: Everted at rest and after stimulation  Comfort (Breast/Nipple): Soft / non-tender     Hold (Positioning): Assistance needed to correctly position infant at breast and maintain latch. Intervention(s): Breastfeeding basics reviewed;Support Pillows  LATCH Score: 8  Lactation Tools Discussed/Used WIC Program: No   Consult Status Consult Status: Follow-up Date: 09/29/14 Follow-up type: In-patient    Jannifer RodneyShoptaw, Jana Lynn 09/28/2014, 4:41 PM

## 2014-09-28 NOTE — Progress Notes (Signed)
Post Partum Day 1 Subjective: no complaints, up ad lib, voiding, tolerating PO, + flatus and breast feeding. Mother and baby are bonding well.  Objective: Blood pressure 128/76, pulse 103, temperature 98.7 F (37.1 C), temperature source Oral, resp. rate 18, height 5\' 11"  (1.803 m), weight 101.606 kg (224 lb), last menstrual period 12/26/2013, SpO2 95 %, unknown if currently breastfeeding.  Physical Exam:  General: alert, cooperative and no distress Lochia: appropriate Uterine Fundus: firm perineum: healing well, no significant erythema DVT Evaluation: No evidence of DVT seen on physical exam. Negative Homan's sign. No cords or calf tenderness.   Recent Labs  09/27/14 0035 09/28/14 0545  HGB 11.4* 10.5*  HCT 34.4* 32.0*    Assessment/Plan: Plan for discharge tomorrow, Breastfeeding and Circumcision prior to discharge   LOS: 2 days   Blue Winther STACIA 09/28/2014, 9:23 AM

## 2014-09-28 NOTE — Anesthesia Postprocedure Evaluation (Signed)
Anesthesia Post Note  Patient: Alexandra [name]  Procedure(s) Performed: * No procedures listed *  Anesthesia type: Epidural  Patient location: Mother/Baby  Post pain: Pain level controlled  Post assessment: Post-op Vital signs reviewed  Last Vitals:  Filed Vitals:   09/28/14 0545  BP: 128/76  Pulse: 103  Temp: 37.1 C  Resp: 18    Post vital signs: Reviewed  Level of consciousness:alert  Complications: No apparent anesthesia complications

## 2014-09-29 LAB — GLUCOSE, CAPILLARY: Glucose-Capillary: 81 mg/dL (ref 65–99)

## 2014-09-29 MED ORDER — IBUPROFEN 600 MG PO TABS: 600.0000 mg | ORAL_TABLET | Freq: Four times a day (QID) | ORAL | Status: DC

## 2014-09-29 NOTE — Lactation Note (Signed)
This note was copied from the chart of Alexandra [name]. Lactation Consultation Note  Patient Name: Alexandra [name]: 09/29/2014 Reason for consult: Follow-up assessment Mom reports baby is nursing well, denies questions/concerns. Baby asleep at this visit. Advised Mom baby should be at the breast 8-12 times and with feeding ques, cluster feeding reviewed. Mom reports baby has been cluster feeding earlier. Encouraged Mom to keep baby at the breast for 15-20 minutes both breasts each feeding when possible. Engorgement care reviewed. Advised of OP services and support group. Encouraged Mom to call if she would like assist before d/c.  Maternal Data    Feeding Feeding Type: Breast Fed Length of feed: 20 min  LATCH Score/Interventions Latch: Grasps breast easily, tongue down, lips flanged, rhythmical sucking.  Audible Swallowing: A few with stimulation Intervention(s): Skin to skin;Hand expression  Type of Nipple: Everted at rest and after stimulation Intervention(s): Double electric pump  Comfort (Breast/Nipple): Soft / non-tender     Hold (Positioning): No assistance needed to correctly position infant at breast. Intervention(s): Skin to skin;Position options;Support Pillows  LATCH Score: 9  Lactation Tools Discussed/Used     Consult Status Consult Status: Complete [name]: 09/29/14 Follow-up type: In-patient    Alfred LevinsGranger, Layla Kesling Ann 09/29/2014, 8:55 AM

## 2014-09-29 NOTE — Discharge Summary (Signed)
Obstetric Discharge Summary Reason for Admission: onset of labor Prenatal Procedures: ultrasound Intrapartum Procedures: spontaneous vaginal delivery Postpartum Procedures: none Complications-Operative and Postpartum: none HEMOGLOBIN  Date Value Ref Range Status  09/28/2014 10.5* 12.0 - 15.0 g/dL Final   HCT  Date Value Ref Range Status  09/28/2014 32.0* 36.0 - 46.0 % Final    Physical Exam:  General: alert and cooperative Lochia: appropriate Uterine Fundus: firm DVT Evaluation: No evidence of DVT seen on physical exam.  Discharge Diagnoses: Term Pregnancy-delivered  Discharge Information: Date: 09/29/2014 Activity: pelvic rest Diet: routine Medications: PNV and Ibuprofen Condition: stable Instructions: refer to practice specific booklet Discharge to: home Follow-up Information    [name] 4 weeks.   Specialty:  Obstetrics   Contact information:   [address] [place] [address] [phone]       Newborn Data: Live born female  Birth Weight: 7 lb 13.6 oz (3561 g) APGAR: 9, 9  Home with mother.  Alexandra [name], Alexandra [name] 09/29/2014, 10:12 AM

## 2014-10-02 ENCOUNTER — Inpatient Hospital Stay (HOSPITAL_COMMUNITY): Admission: AD | Admit: 2014-10-02 | Payer: 59 | Source: Ambulatory Visit | Admitting: Obstetrics & Gynecology

## 2014-10-27 ENCOUNTER — Encounter (HOSPITAL_COMMUNITY): Payer: Self-pay | Admitting: *Deleted

## 2014-10-27 ENCOUNTER — Other Ambulatory Visit (HOSPITAL_COMMUNITY): Payer: Self-pay | Admitting: Gynecology

## 2014-10-27 ENCOUNTER — Observation Stay (EMERGENCY_DEPARTMENT_HOSPITAL)
Admission: AD | Admit: 2014-10-27 | Discharge: 2014-10-27 | Discharge status: Against Medical Advice | Payer: 59 | Source: Ambulatory Visit | Attending: Obstetrics and Gynecology | Admitting: Obstetrics and Gynecology

## 2014-10-27 DIAGNOSIS — Z886 Allergy status to analgesic agent status: Secondary | ICD-10-CM | POA: Insufficient documentation

## 2014-10-27 DIAGNOSIS — O9989 Other specified diseases and conditions complicating pregnancy, childbirth and the puerperium: Secondary | ICD-10-CM

## 2014-10-27 DIAGNOSIS — F329 Major depressive disorder, single episode, unspecified: Secondary | ICD-10-CM

## 2014-10-27 DIAGNOSIS — Z3A00 Weeks of gestation of pregnancy not specified: Secondary | ICD-10-CM

## 2014-10-27 DIAGNOSIS — O9089 Other complications of the puerperium, not elsewhere classified: Secondary | ICD-10-CM | POA: Diagnosis not present

## 2014-10-27 DIAGNOSIS — O1493 Unspecified pre-eclampsia, third trimester: Secondary | ICD-10-CM | POA: Diagnosis not present

## 2014-10-27 DIAGNOSIS — O99355 Diseases of the nervous system complicating the puerperium: Secondary | ICD-10-CM

## 2014-10-27 DIAGNOSIS — Z87891 Personal history of nicotine dependence: Secondary | ICD-10-CM

## 2014-10-27 DIAGNOSIS — O169 Unspecified maternal hypertension, unspecified trimester: Secondary | ICD-10-CM | POA: Diagnosis not present

## 2014-10-27 DIAGNOSIS — I158 Other secondary hypertension: Secondary | ICD-10-CM | POA: Diagnosis present

## 2014-10-27 DIAGNOSIS — O99345 Other mental disorders complicating the puerperium: Secondary | ICD-10-CM | POA: Insufficient documentation

## 2014-10-27 DIAGNOSIS — E282 Polycystic ovarian syndrome: Secondary | ICD-10-CM

## 2014-10-27 DIAGNOSIS — G43909 Migraine, unspecified, not intractable, without status migrainosus: Secondary | ICD-10-CM

## 2014-10-27 DIAGNOSIS — O1003 Pre-existing essential hypertension complicating the puerperium: Secondary | ICD-10-CM | POA: Insufficient documentation

## 2014-10-27 DIAGNOSIS — I160 Hypertensive urgency: Secondary | ICD-10-CM | POA: Diagnosis present

## 2014-10-27 LAB — CBC
HCT: 39.1 % (ref 36.0–46.0)
HEMOGLOBIN: 12.7 g/dL (ref 12.0–15.0)
MCH: 27 pg (ref 26.0–34.0)
MCHC: 32.5 g/dL (ref 30.0–36.0)
MCV: 83.2 fL (ref 78.0–100.0)
Platelets: 256 10*3/uL (ref 150–400)
RBC: 4.7 MIL/uL (ref 3.87–5.11)
RDW: 15.4 % (ref 11.5–15.5)
WBC: 7.4 10*3/uL (ref 4.0–10.5)

## 2014-10-27 LAB — COMPREHENSIVE METABOLIC PANEL
ALBUMIN: 3.8 g/dL (ref 3.5–5.0)
ALK PHOS: 78 U/L (ref 38–126)
ALT: 19 U/L (ref 14–54)
AST: 22 U/L (ref 15–41)
Anion gap: 9 (ref 5–15)
BUN: 7 mg/dL (ref 6–20)
CHLORIDE: 106 mmol/L (ref 101–111)
CO2: 23 mmol/L (ref 22–32)
Calcium: 9 mg/dL (ref 8.9–10.3)
Creatinine, Ser: 0.77 mg/dL (ref 0.44–1.00)
GFR calc non Af Amer: >60 mL/min (ref >60)
Glucose, Bld: 82 mg/dL (ref 65–99)
Potassium: 3.5 mmol/L (ref 3.5–5.1)
Sodium: 138 mmol/L (ref 135–145)
Total Bilirubin: 0.7 mg/dL (ref 0.3–1.2)
Total Protein: 7.1 g/dL (ref 6.5–8.1)

## 2014-10-27 LAB — URINALYSIS, ROUTINE W REFLEX MICROSCOPIC
Bilirubin Urine: NEGATIVE
Glucose, UA: NEGATIVE mg/dL
Ketones, ur: NEGATIVE mg/dL
NITRITE: NEGATIVE
PH: 6 (ref 5.0–8.0)
Protein, ur: NEGATIVE mg/dL
Specific Gravity, Urine: 1.01 (ref 1.005–1.030)
UROBILINOGEN UA: 0.2 mg/dL (ref 0.0–1.0)

## 2014-10-27 LAB — LACTATE DEHYDROGENASE: LDH: 177 U/L (ref 98–192)

## 2014-10-27 LAB — PROTEIN / CREATININE RATIO, URINE
CREATININE, URINE: 68 mg/dL
Total Protein, Urine: <6 mg/dL

## 2014-10-27 LAB — URIC ACID: Uric Acid, Serum: 5.9 mg/dL (ref 2.3–6.6)

## 2014-10-27 LAB — URINE MICROSCOPIC-ADD ON

## 2014-10-27 MED ORDER — LACTATED RINGERS IV SOLN
INTRAVENOUS | Status: DC
  Administered 2014-10-27: 14:00:00 via INTRAVENOUS

## 2014-10-27 MED ORDER — SIMETHICONE 80 MG PO CHEW: 80.0000 mg | CHEWABLE_TABLET | Freq: Four times a day (QID) | ORAL | Status: DC | PRN

## 2014-10-27 MED ORDER — HYDRALAZINE HCL 20 MG/ML IJ SOLN
10.0000 mg | Freq: Once | INTRAMUSCULAR | Status: AC | PRN
  Administered 2014-10-27: 10 mg via INTRAVENOUS

## 2014-10-27 MED ORDER — LABETALOL HCL 5 MG/ML IV SOLN
20.0000 mg | INTRAVENOUS | Status: DC | PRN
Start: 2014-10-27 — End: 2014-10-27

## 2014-10-27 MED ORDER — SODIUM CHLORIDE 0.9 % IV SOLN: INTRAVENOUS | Status: DC

## 2014-10-27 MED ORDER — DILTIAZEM HCL ER COATED BEADS 120 MG PO CP24
120.0000 mg | ORAL_CAPSULE | Freq: Every day | ORAL | Status: DC
  Administered 2014-10-27: 120 mg via ORAL
  Filled 2014-10-27 (×2): qty 1

## 2014-10-27 MED ORDER — LABETALOL HCL 5 MG/ML IV SOLN: 20.0000 mg | INTRAVENOUS | Status: DC | PRN

## 2014-10-27 MED ORDER — DILTIAZEM HCL 120 MG PO TABS: 120.0000 mg | ORAL_TABLET | Freq: Four times a day (QID) | ORAL | Status: DC

## 2014-10-27 MED ORDER — PRENATAL MULTIVITAMIN CH: 1.0000 | ORAL_TABLET | Freq: Every day | ORAL | Status: DC

## 2014-10-27 MED ORDER — LABETALOL HCL 5 MG/ML IV SOLN
20.0000 mg | INTRAVENOUS | Status: AC | PRN
  Administered 2014-10-27: 20 mg via INTRAVENOUS
  Administered 2014-10-27: 80 mg via INTRAVENOUS
  Administered 2014-10-27: 20 mg via INTRAVENOUS
  Filled 2014-10-27: qty 4
  Filled 2014-10-27: qty 16
  Filled 2014-10-27: qty 8

## 2014-10-27 MED ORDER — SENNOSIDES-DOCUSATE SODIUM 8.6-50 MG PO TABS: 1.0000 | ORAL_TABLET | Freq: Every evening | ORAL | Status: DC | PRN

## 2014-10-27 MED ORDER — HYDRALAZINE HCL 20 MG/ML IJ SOLN
10.0000 mg | Freq: Once | INTRAMUSCULAR | Status: DC | PRN
  Filled 2014-10-27 (×2): qty 1

## 2014-10-27 MED ORDER — ACETAMINOPHEN 500 MG PO TABS
1000.0000 mg | ORAL_TABLET | Freq: Once | ORAL | Status: DC
  Filled 2014-10-27: qty 2

## 2014-10-27 MED ORDER — HYDRALAZINE HCL 20 MG/ML IJ SOLN
10.0000 mg | Freq: Once | INTRAMUSCULAR | Status: DC | PRN
Start: 2014-10-27 — End: 2014-10-27

## 2014-10-27 NOTE — MAU Note (Signed)
Pt states that she will not be staying in the hospital and will sign ut AMA. Pt states that she will be compliant with taking BP meds.and then taking BP at home. Instructions given re: s/s of increased BP. Wende Bushy NP aware and is signing prescriptions.

## 2014-10-27 NOTE — MAU Note (Signed)
RN called Dr Tenny Craw to inform her that pt refuses to be admitted; states that she does not have anyone, to stay with her so that her baby can stay with her; MD stressing the need for admission;

## 2014-10-27 NOTE — MAU Note (Signed)
One month postpartum with PIH symptoms;

## 2014-10-27 NOTE — MAU Note (Signed)
Pt sent from office for elevated b/p 162/120. Post partum 09/27/14. Denies H/A or visual disturbances.

## 2014-10-27 NOTE — MAU Provider Note (Signed)
History     CSN: [id number]  Arrival date and time: 10/27/14 1237   First Provider Initiated Contact with Patient 10/27/14 1327      Chief Complaint  Patient presents with  . Hypertension   HPI Pt is G3P1021 1 month PP with induction of labor at 39 weeks for Gestational Diabetes.  Pt was seen in the office today for PP exam and had elevated BP(Dr. Chestine Spore) Pt is breast feeding. Pt has not had any bleeding for 8 days,. Pt states she did not have hypertension with her pregnancy- she said her blood pressure would go up and down Pt denies headache, swelling, abd pain, constipation or diarrhea, or UTI sx. Pt states she was feeling really good today   Past Medical History  Diagnosis Date  . Frequent headaches   . Anemia   . PCOS (polycystic ovarian syndrome)   . Depression   . Sciatic pain   . Preterm labor   . Hypertension   . Vaginal Pap smear, abnormal   . Gestational diabetes     Past Surgical History  Procedure Laterality Date  . Dilation and curettage of uterus    . Colposcopy      Family History  Problem Relation Age of Onset  . Hypertension Mother   . Heart disease Mother   . Hyperlipidemia Mother   . Migraines Mother   . Heart disease Father   . Stomach cancer Father        . Hypertension Father   . Cancer Sister   . Diabetes Paternal Aunt   . Thyroid disease Other   . Migraines Brother   . Birth defects Brother     brain tumor    Social History  Substance Use Topics  . Smoking status: Former Smoker    Quit date: 01/17/2014  . Smokeless tobacco: Former Neurosurgeon    Quit date: 01/17/2014  . Alcohol Use: No     Comment: ocassionally- 2 per year    Allergies:  Allergies  Allergen Reactions  . Lortab [Hydrocodone-Acetaminophen] Itching    Prescriptions prior to admission  Medication Sig Dispense Refill Last Dose  . ferrous sulfate 325 (65 FE) MG tablet Take 325 mg by mouth daily with breakfast.   Past Week at Unknown time  . ibuprofen  (ADVIL,MOTRIN) 600 MG tablet Take 1 tablet (600 mg total) by mouth every 6 (six) hours. 30 tablet 0   . prenatal vitamin w/FE, FA (PRENATAL 1 + 1) 27-1 MG TABS tablet Take 1 tablet by mouth daily at 12 noon.   09/26/2014 at Unknown time    Review of Systems  Constitutional: Negative for fever and chills.  Eyes: Negative for blurred vision.  Cardiovascular: Negative for leg swelling.  Gastrointestinal: Negative for heartburn, nausea, vomiting, abdominal pain, diarrhea and constipation.  Genitourinary: Negative for dysuria.  Neurological: Negative for headaches.   Physical Exam   Blood pressure 184/129, pulse 82, temperature 98.3 F (36.8 C), temperature source Oral, resp. rate 18, height 5\' 11"  (1.803 m), weight 200 lb 9.6 oz (90.992 kg), unknown if currently breastfeeding.  Physical Exam  Vitals reviewed. Constitutional: She is oriented to person, place, and time. She appears well-developed and well-nourished. No distress.  HENT:  Head: Normocephalic.  Eyes: Pupils are equal, round, and reactive to light.  Neck: Normal range of motion. Neck supple.  Cardiovascular: Normal rate.   Respiratory: Effort normal.  GI: Soft.  Musculoskeletal: Normal range of motion. She exhibits no edema.  Neurological: She is alert  and oriented to person, place, and time.  Skin: Skin is warm and dry.  Psychiatric: She has a normal mood and affect.    MAU Course  Procedures Reviewed pressures with Dr. Tenny Craw 184/129, 183/130; 176/116; 170/115 Pre-eclampsia labetalol order set implemented Labetalol 20, 40 , 80 mg IV given and Apresaline 10 mg Admit for 23 obs with - Dr. Tenny Craw to put in orders  Assessment and Plan    Alexandra [name] 10/27/2014, 1:27 PM

## 2014-10-27 NOTE — H&P (Addendum)
Chief complaint: Elevated blood pressures  HISTORY of present illness: 39 year old gravida 3 para 1021 4 weeks postpartum from a spontaneous vaginal delivery presents to the office for her routine postpartum visit. In the office her blood pressures were noted to be 170s to 180s over 110s. She has no history of hypertension. She denied any symptoms of headache, vision changes, nausea, vomiting. Given the severe range blood pressures the patient was sent to the hospital for evaluation. In maternity admissions for blood pressures were noted to be 160-180 systolic over 100-120 diastolic. She received IV labetalol in an accelerated fashion with a total of 160 mg. She also received IV hydralazine 5 mg 1. After receiving all of these medications her blood pressures were 150-160s over 90s. Preeclampsia labs were performed and were all within normal limits. Given the severely elevated blood pressures of new onset the decision was made to admit the patient for hypertensive urgency with antihypertensives therapy in the ICU with telemetry.  Active Ambulatory Problems    Diagnosis Date Noted  . Pregnancy 04/04/2014  . [redacted] weeks gestation of pregnancy   . 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 High-risk pregnancy 01/22/2014  . Migraine 04/27/2014  . History of preterm premature rupture of membranes (PROM) in previous pregnancy, currently pregnant   . Pelvic pressure in pregnancy, antepartum   . [redacted] weeks gestation of pregnancy    Resolved Ambulatory Problems    Diagnosis Date Noted  . No Resolved Ambulatory Problems   Past Medical History  Diagnosis Date  . Frequent headaches   . Anemia   . PCOS (polycystic ovarian syndrome)   . Depression   . Sciatic pain   . Preterm labor   . Hypertension   . Vaginal Pap smear, abnormal   . Gestational diabetes    Filed Vitals:   10/27/14 1630 10/27/14 1650 10/27/14 1705 10/27/14 1721  BP: 166/95 175/95 175/95 152/109  Pulse:  79 84  84  Temp:      TempSrc:      Resp:      Height:      Weight:        Physical Exam  Constitutional: She is oriented to person, place, and time. She appears well-developed and well-nourished.  HENT:  Head: Normocephalic and atraumatic.  Neck: Normal range of motion. Neck supple.  Pulmonary/Chest: Effort normal.  Abdominal: Soft.  Neurological: She is alert and oriented to person, place, and time.  Skin: Skin is warm and dry.  Psychiatric: She has a normal mood and affect.   Results for orders placed or performed during the hospital encounter of 10/27/14 (from the past 24 hour(s))  Comprehensive metabolic panel     Status: None   Collection Time: 10/27/14  1:47 PM  Result Value Ref Range   Sodium 138 135 - 145 mmol/L   Potassium 3.5 3.5 - 5.1 mmol/L   Chloride 106 101 - 111 mmol/L   CO2 23 22 - 32 mmol/L   Glucose, Bld 82 65 - 99 mg/dL   BUN 7 6 - 20 mg/dL   Creatinine, Ser 4.09 0.44 - 1.00 mg/dL   Calcium 9.0 8.9 - 81.1 mg/dL   Total Protein 7.1 6.5 - 8.1 g/dL   Albumin 3.8 3.5 - 5.0 g/dL   AST 22 15 - 41 U/L   ALT 19 14 - 54 U/L   Alkaline Phosphatase 78 38 - 126 U/L   Total Bilirubin 0.7 0.3 - 1.2 mg/dL  GFR calc non Af Amer >60 >60 mL/min   GFR calc Af Amer >60 >60 mL/min   Anion gap 9 5 - 15  CBC     Status: None   Collection Time: 10/27/14  1:47 PM  Result Value Ref Range   WBC 7.4 4.0 - 10.5 K/uL   RBC 4.70 3.87 - 5.11 MIL/uL   Hemoglobin 12.7 12.0 - 15.0 g/dL   HCT 16.1 09.6 - 04.5 %   MCV 83.2 78.0 - 100.0 fL   MCH 27.0 26.0 - 34.0 pg   MCHC 32.5 30.0 - 36.0 g/dL   RDW 40.9 81.1 - 91.4 %   Platelets 256 150 - 400 K/uL  Lactate dehydrogenase     Status: None   Collection Time: 10/27/14  1:47 PM  Result Value Ref Range   LDH 177 98 - 192 U/L  Uric acid     Status: None   Collection Time: 10/27/14  1:47 PM  Result Value Ref Range   Uric Acid, Serum 5.9 2.3 - 6.6 mg/dL  Protein / creatinine ratio, urine     Status: None   Collection Time:  10/27/14  1:50 PM  Result Value Ref Range   Creatinine, Urine 68.00 mg/dL   Total Protein, Urine <6 mg/dL   Protein Creatinine Ratio        0.00 - 0.15 mg/mg[Cre]  Urinalysis, Routine w reflex microscopic (not at [facility])     Status: Abnormal   Collection Time: 10/27/14  1:50 PM  Result Value Ref Range   Color, Urine YELLOW YELLOW   APPearance CLEAR CLEAR   Specific Gravity, Urine 1.010 1.005 - 1.030   pH 6.0 5.0 - 8.0   Glucose, UA NEGATIVE NEGATIVE mg/dL   Hgb urine dipstick SMALL (A) NEGATIVE   Bilirubin Urine NEGATIVE NEGATIVE   Ketones, ur NEGATIVE NEGATIVE mg/dL   Protein, ur NEGATIVE NEGATIVE mg/dL   Urobilinogen, UA 0.2 0.0 - 1.0 mg/dL   Nitrite NEGATIVE NEGATIVE   Leukocytes, UA TRACE (A) NEGATIVE  Urine microscopic-add on     Status: Abnormal   Collection Time: 10/27/14  1:50 PM  Result Value Ref Range   Squamous Epithelial / LPF FEW (A) RARE   WBC, UA 3-6 <3 WBC/hpf   RBC / HPF 0-2 <3 RBC/hpf   Bacteria, UA FEW (A) RARE   Urine-Other MUCOUS PRESENT    A/P: Asymptomatic hypertensive urgency 1) Admit to ICU for hypertensive management & telemetry 2) Will start diltiazem 120 mg ER given patient breast feeding, may need to add second agent 3) SCDs for DVT prophylaxis 4) May need medicine consult tomorrow for further recommendations and to initiate outpatient management

## 2014-10-28 ENCOUNTER — Inpatient Hospital Stay (HOSPITAL_COMMUNITY)
Admission: AD | Admit: 2014-10-28 | Discharge: 2014-10-29 | DRG: 776 | Disposition: A | Discharge status: Home / Self Care | Payer: 59 | Source: Ambulatory Visit | Attending: Obstetrics & Gynecology | Admitting: Obstetrics & Gynecology

## 2014-10-28 DIAGNOSIS — O1490 Unspecified pre-eclampsia, unspecified trimester: Secondary | ICD-10-CM | POA: Diagnosis not present

## 2014-10-28 DIAGNOSIS — I10 Essential (primary) hypertension: Secondary | ICD-10-CM | POA: Diagnosis not present

## 2014-10-28 DIAGNOSIS — O165 Unspecified maternal hypertension, complicating the puerperium: Secondary | ICD-10-CM | POA: Diagnosis present

## 2014-10-28 DIAGNOSIS — O9089 Other complications of the puerperium, not elsewhere classified: Secondary | ICD-10-CM | POA: Diagnosis present

## 2014-10-28 DIAGNOSIS — I158 Other secondary hypertension: Secondary | ICD-10-CM | POA: Diagnosis present

## 2014-10-28 DIAGNOSIS — O169 Unspecified maternal hypertension, unspecified trimester: Secondary | ICD-10-CM | POA: Diagnosis not present

## 2014-10-28 LAB — CBC WITH DIFFERENTIAL/PLATELET
BASOS PCT: 1 % (ref 0–1)
Basophils Absolute: 0 10*3/uL (ref 0.0–0.1)
EOS ABS: 0.2 10*3/uL (ref 0.0–0.7)
EOS PCT: 3 % (ref 0–5)
HCT: 39.2 % (ref 36.0–46.0)
Hemoglobin: 12.7 g/dL (ref 12.0–15.0)
Lymphocytes Relative: 28 % (ref 12–46)
Lymphs Abs: 2.3 10*3/uL (ref 0.7–4.0)
MCH: 27 pg (ref 26.0–34.0)
MCHC: 32.4 g/dL (ref 30.0–36.0)
MCV: 83.4 fL (ref 78.0–100.0)
Monocytes Absolute: 0.4 10*3/uL (ref 0.1–1.0)
Monocytes Relative: 6 % (ref 3–12)
NEUTROS PCT: 62 % (ref 43–77)
Neutro Abs: 5.1 10*3/uL (ref 1.7–7.7)
Platelets: 272 10*3/uL (ref 150–400)
RBC: 4.7 MIL/uL (ref 3.87–5.11)
RDW: 15.6 % — ABNORMAL HIGH (ref 11.5–15.5)
WBC: 8.1 10*3/uL (ref 4.0–10.5)

## 2014-10-28 LAB — COMPREHENSIVE METABOLIC PANEL
ALBUMIN: 3.8 g/dL (ref 3.5–5.0)
ALT: 18 U/L (ref 14–54)
AST: 18 U/L (ref 15–41)
Alkaline Phosphatase: 82 U/L (ref 38–126)
Anion gap: 6 (ref 5–15)
BUN: 7 mg/dL (ref 6–20)
CO2: 26 mmol/L (ref 22–32)
Calcium: 8.6 mg/dL — ABNORMAL LOW (ref 8.9–10.3)
Chloride: 110 mmol/L (ref 101–111)
Creatinine, Ser: 0.76 mg/dL (ref 0.44–1.00)
GFR calc Af Amer: >60 mL/min (ref >60)
GFR calc non Af Amer: >60 mL/min (ref >60)
GLUCOSE: 100 mg/dL — AB (ref 65–99)
Potassium: 3.2 mmol/L — ABNORMAL LOW (ref 3.5–5.1)
SODIUM: 142 mmol/L (ref 135–145)
TOTAL PROTEIN: 7.1 g/dL (ref 6.5–8.1)
Total Bilirubin: 0.4 mg/dL (ref 0.3–1.2)

## 2014-10-28 LAB — MRSA PCR SCREENING: MRSA BY PCR: NEGATIVE

## 2014-10-28 LAB — URIC ACID: URIC ACID, SERUM: 5.3 mg/dL (ref 2.3–6.6)

## 2014-10-28 MED ORDER — HYDRALAZINE HCL 20 MG/ML IJ SOLN: 10.0000 mg | Freq: Once | INTRAMUSCULAR | Status: DC | PRN

## 2014-10-28 MED ORDER — ONDANSETRON HCL 4 MG/2ML IJ SOLN
4.0000 mg | Freq: Four times a day (QID) | INTRAMUSCULAR | Status: DC | PRN
  Administered 2014-10-28 – 2014-10-29 (×2): 4 mg via INTRAVENOUS
  Filled 2014-10-28 (×2): qty 2

## 2014-10-28 MED ORDER — MAGNESIUM SULFATE BOLUS VIA INFUSION
4.0000 g | Freq: Once | INTRAVENOUS | Status: AC
  Administered 2014-10-28: 4 g via INTRAVENOUS
  Filled 2014-10-28: qty 500

## 2014-10-28 MED ORDER — LACTATED RINGERS IV SOLN
2.0000 g/h | INTRAVENOUS | Status: AC
  Administered 2014-10-28 – 2014-10-29 (×2): 2 g/h via INTRAVENOUS
  Filled 2014-10-28 (×2): qty 80

## 2014-10-28 MED ORDER — LACTATED RINGERS IV SOLN
INTRAVENOUS | Status: AC
  Administered 2014-10-28: 11:00:00 via INTRAVENOUS

## 2014-10-28 MED ORDER — LABETALOL HCL 200 MG PO TABS
200.0000 mg | ORAL_TABLET | Freq: Three times a day (TID) | ORAL | Status: DC
  Administered 2014-10-28 – 2014-10-29 (×5): 200 mg via ORAL
  Filled 2014-10-28 (×7): qty 1

## 2014-10-28 MED ORDER — LABETALOL HCL 5 MG/ML IV SOLN
20.0000 mg | INTRAVENOUS | Status: DC | PRN
  Administered 2014-10-28: 20 mg via INTRAVENOUS
  Administered 2014-10-28: 40 mg via INTRAVENOUS
  Filled 2014-10-28: qty 4
  Filled 2014-10-28: qty 8

## 2014-10-28 MED ORDER — DILTIAZEM HCL ER 120 MG PO CP24
120.0000 mg | ORAL_CAPSULE | Freq: Every day | ORAL | Status: DC
  Filled 2014-10-28: qty 1

## 2014-10-28 MED ORDER — PROMETHAZINE HCL 25 MG/ML IJ SOLN
25.0000 mg | Freq: Four times a day (QID) | INTRAMUSCULAR | Status: DC | PRN
  Administered 2014-10-28: 25 mg via INTRAVENOUS
  Filled 2014-10-28: qty 1

## 2014-10-28 MED ORDER — BUTALBITAL-APAP-CAFFEINE 50-325-40 MG PO TABS
2.0000 | ORAL_TABLET | ORAL | Status: DC | PRN
  Administered 2014-10-28 – 2014-10-29 (×6): 2 via ORAL
  Filled 2014-10-28 (×7): qty 2

## 2014-10-28 MED ORDER — OXYCODONE-ACETAMINOPHEN 5-325 MG PO TABS
1.0000 | ORAL_TABLET | ORAL | Status: DC | PRN
Start: 2014-10-28 — End: 2014-10-29
  Administered 2014-10-28 (×2): 2 via ORAL
  Filled 2014-10-28 (×2): qty 2

## 2014-10-28 NOTE — MAU Note (Addendum)
Having headache when left here and not getting any better.  No problems with vision.  Delivered July 11th, vaginal. Left earlier but supposed to be admit, reports her family is 5 hours away and was hard to get childcare but would be admitted now but unsure if can stay full 24 hours.

## 2014-10-28 NOTE — Progress Notes (Signed)
CRITICAL CARE NOTE - AICU    Patient Details:    Alexandra [name] is an 39 y.o. female post partum day 30 admitted for hypertensive crisis.    Lines, Airways, Drains: IV left arm     Best Practice/Protocols:  VTE Prophylaxis: Mechanical   Subjective:    Patient reports worsening headache, no visual changes no RUQ pain.  She denies nausea or vomiting  Objective:  Vital signs for last 24 hours: Temp:  [97.9 F (36.6 C)-98.3 F (36.8 C)] 98.2 F (36.8 C) (08/11 0400) Pulse Rate:  [62-88] 65 (08/11 1000) Resp:  [12-18] 15 (08/11 1000) BP: (122-186)/(75-130) 146/90 mmHg (08/11 1000) SpO2:  [97 %-100 %] 98 % (08/11 1000) Weight:  [199 lb (90.266 kg)-200 lb 9.6 oz (90.992 kg)] 199 lb (90.266 kg) (08/11 0117)   Physical Exam:  General: alert, no respiratory distress and awake an oriented x4 Neuro: alert, oriented, nonfocal exam and DTRs 2+ No clonus GI: soft, nontender, BS WNL, no r/g Extremities: no edema, no erythema, pulses WNL  Assessment/Plan:   NEURO  Risk for Eclampsia present d/t severe range BPs and HA   Plan: Start magnesium Sulfate for at least 12 hours           Percocet prn pain           Will add phenergan to regimen as this sometimes helps HA's  PULM  No issues   Plan: monitor pulse ox   CARDIO  Hyptensive Crisis   Plan: change Rx to labetalol 200 mg TID  RENAL  no proteinuria   Plan: monitor output closely  GI  No issues   Plan: Monitor   LOS: 0 days     Critical Care Total Time*: 30 Minutes  Essie Hart STACIA 10/28/2014

## 2014-10-28 NOTE — MAU Note (Signed)
PT WAS IN MAU EARLIER-  COULD  NOT STAY FOR ADMISSION.  PT  HAS RETURNED  - WANTING  ADMISSION.

## 2014-10-28 NOTE — Progress Notes (Signed)
UR chart review completed.  

## 2014-10-28 NOTE — Progress Notes (Signed)
Patient stated she had fell nauseous earlier (approximately [phone]).  RN gave patient saltine crackers per request to eat, while patient was waiting on her breakfast order.  Patient reports not having eaten much in past two days.  [id number] - Patient rang call bell.  RN went to room.  Patient had vomited approximately 150 ml of clear fluid in basin.  Dr. Mora Appl notified of VS, headache, and that patient has no order for prn nausea medicine.  Dr. Mora Appl gave order for patient to receive Zofran 4 mg IV every 6 hours as needed for nausea.

## 2014-10-29 ENCOUNTER — Encounter (HOSPITAL_COMMUNITY): Payer: Self-pay | Admitting: *Deleted

## 2014-10-29 DIAGNOSIS — O165 Unspecified maternal hypertension, complicating the puerperium: Secondary | ICD-10-CM | POA: Diagnosis present

## 2014-10-29 MED ORDER — SODIUM CHLORIDE 0.9 % IJ SOLN
3.0000 mL | Freq: Two times a day (BID) | INTRAMUSCULAR | Status: DC
  Administered 2014-10-29: 3 mL via INTRAVENOUS

## 2014-10-29 MED ORDER — SODIUM CHLORIDE 0.9 % IV SOLN: 250.0000 mL | INTRAVENOUS | Status: DC | PRN

## 2014-10-29 MED ORDER — BUTALBITAL-APAP-CAFFEINE 50-325-40 MG PO TABS: 2.0000 | ORAL_TABLET | ORAL | Status: DC | PRN

## 2014-10-29 MED ORDER — LABETALOL HCL 200 MG PO TABS: 200.0000 mg | ORAL_TABLET | Freq: Three times a day (TID) | ORAL | Status: AC

## 2014-10-29 MED ORDER — SODIUM CHLORIDE 0.9 % IJ SOLN: 3.0000 mL | INTRAMUSCULAR | Status: DC | PRN

## 2014-10-29 NOTE — Progress Notes (Signed)
Patient is eating, ambulating, and voiding.  Pain control is good.  Filed Vitals:   10/29/14 0614 10/29/14 0700 10/29/14 0800 10/29/14 0807  BP: 135/90 141/87 131/85   Pulse: 73 72 80   Temp:      TempSrc:      Resp:    20  Height:      Weight:      SpO2: 96% 97% 96%     lungs:   clear to auscultation cor:    RRR Abdomen:  soft, appropriate tenderness, incisions intact and without erythema or exudate. ex:    no cords   Results for orders placed or performed during the hospital encounter of 10/28/14 (from the past 24 hour(s))  CBC with Differential/Platelet     Status: Abnormal   Collection Time: 10/28/14 11:03 AM  Result Value Ref Range   WBC 8.1 4.0 - 10.5 K/uL   RBC 4.70 3.87 - 5.11 MIL/uL   Hemoglobin 12.7 12.0 - 15.0 g/dL   HCT 16.1 09.6 - 04.5 %   MCV 83.4 78.0 - 100.0 fL   MCH 27.0 26.0 - 34.0 pg   MCHC 32.4 30.0 - 36.0 g/dL   RDW 40.9 (H) 81.1 - 91.4 %   Platelets 272 150 - 400 K/uL   Neutrophils Relative % 62 43 - 77 %   Neutro Abs 5.1 1.7 - 7.7 K/uL   Lymphocytes Relative 28 12 - 46 %   Lymphs Abs 2.3 0.7 - 4.0 K/uL   Monocytes Relative 6 3 - 12 %   Monocytes Absolute 0.4 0.1 - 1.0 K/uL   Eosinophils Relative 3 0 - 5 %   Eosinophils Absolute 0.2 0.0 - 0.7 K/uL   Basophils Relative 1 0 - 1 %   Basophils Absolute 0.0 0.0 - 0.1 K/uL  Comprehensive metabolic panel     Status: Abnormal   Collection Time: 10/28/14 11:03 AM  Result Value Ref Range   Sodium 142 135 - 145 mmol/L   Potassium 3.2 (L) 3.5 - 5.1 mmol/L   Chloride 110 101 - 111 mmol/L   CO2 26 22 - 32 mmol/L   Glucose, Bld 100 (H) 65 - 99 mg/dL   BUN 7 6 - 20 mg/dL   Creatinine, Ser 7.82 0.44 - 1.00 mg/dL   Calcium 8.6 (L) 8.9 - 10.3 mg/dL   Total Protein 7.1 6.5 - 8.1 g/dL   Albumin 3.8 3.5 - 5.0 g/dL   AST 18 15 - 41 U/L   ALT 18 14 - 54 U/L   Alkaline Phosphatase 82 38 - 126 U/L   Total Bilirubin 0.4 0.3 - 1.2 mg/dL   GFR calc non Af Amer >60 >60 mL/min   GFR calc Af Amer >60 >60 mL/min   Anion gap 6 5 - 15  Uric acid     Status: None   Collection Time: 10/28/14 11:03 AM  Result Value Ref Range   Uric Acid, Serum 5.3 2.3 - 6.6 mg/dL    A/P AICU hypertensive crisis PP #30 and HD #2.  Pt did well yesterday on MgSO4 and labetalol 200 mg po tid.  Will d/c MgSO4 at 24 hours and watch BPs for rest of day on labetalol.  HA resolving with fiorcet and phenergan.  Labs yesteday negative for signs of Hellp.  Consider d/c tomorrow.

## 2014-10-29 NOTE — Progress Notes (Addendum)
Mom readmitted on Mag. Used her own pump yesterday but sent it home with dad. Has not pumped today.[employer] pump set up for mom and she is going to take a shower then pump afterwards. Reviewed use and cleaning of pump parts, Encouraged to pump q 3 hours to prevent engorgement and maintain milk supply. No questions at present

## 2014-10-29 NOTE — Progress Notes (Signed)
Pt discharged home with FOB.

## 2014-10-29 NOTE — Discharge Summary (Signed)
Physician Discharge Summary  Patient ID: Alexandra [name] MRN: [id number] DOB/AGE: [birth year] 39 y.o.  Admit date: 10/28/2014 Discharge date: 10/29/2014  Admission Diagnoses:hypertensive crisis  Discharge Diagnoses:  Active Problems:   Pre-eclampsia   Postpartum hypertension   Discharged Condition: good  Hospital Course: Pt admitted about 28 days post partum for hypertensive crisis with BPs 170s/120s.  Pt started on nifedipine but continued to have increased BPS.  On HD 1 pt was changed to labetalol 200 mg TID and started on magnesium in case of late onset preeclampsia.  After pt stopped magnesium, headaches resolved and BPs stayed stable at 120s-140s/90s.  Pt feeling much better.  Pt needs to take infant to pediatric appt tomorrow and is doing so well, she can be d/ced today on HD 2.  Consults: None  Significant Diagnostic Studies: labs:  Results for orders placed or performed during the hospital encounter of 10/28/14 (from the past 48 hour(s))  MRSA PCR Screening     Status: None   Collection Time: 10/28/14  2:22 AM  Result Value Ref Range   MRSA by PCR NEGATIVE NEGATIVE    Comment:        The GeneXpert MRSA Assay (FDA approved for NASAL specimens only), is one component of a comprehensive MRSA colonization surveillance program. It is not intended to diagnose MRSA infection nor to guide or monitor treatment for MRSA infections.   CBC with Differential/Platelet     Status: Abnormal   Collection Time: 10/28/14 11:03 AM  Result Value Ref Range   WBC 8.1 4.0 - 10.5 K/uL   RBC 4.70 3.87 - 5.11 MIL/uL   Hemoglobin 12.7 12.0 - 15.0 g/dL   HCT 39.2 36.0 - 46.0 %   MCV 83.4 78.0 - 100.0 fL   MCH 27.0 26.0 - 34.0 pg   MCHC 32.4 30.0 - 36.0 g/dL   RDW 15.6 (H) 11.5 - 15.5 %   Platelets 272 150 - 400 K/uL   Neutrophils Relative % 62 43 - 77 %   Neutro Abs 5.1 1.7 - 7.7 K/uL   Lymphocytes Relative 28 12 - 46 %   Lymphs Abs 2.3 0.7 - 4.0 K/uL   Monocytes Relative 6 3 - 12 %    Monocytes Absolute 0.4 0.1 - 1.0 K/uL   Eosinophils Relative 3 0 - 5 %   Eosinophils Absolute 0.2 0.0 - 0.7 K/uL   Basophils Relative 1 0 - 1 %   Basophils Absolute 0.0 0.0 - 0.1 K/uL  Comprehensive metabolic panel     Status: Abnormal   Collection Time: 10/28/14 11:03 AM  Result Value Ref Range   Sodium 142 135 - 145 mmol/L   Potassium 3.2 (L) 3.5 - 5.1 mmol/L   Chloride 110 101 - 111 mmol/L   CO2 26 22 - 32 mmol/L   Glucose, Bld 100 (H) 65 - 99 mg/dL   BUN 7 6 - 20 mg/dL   Creatinine, Ser 0.76 0.44 - 1.00 mg/dL   Calcium 8.6 (L) 8.9 - 10.3 mg/dL   Total Protein 7.1 6.5 - 8.1 g/dL   Albumin 3.8 3.5 - 5.0 g/dL   AST 18 15 - 41 U/L   ALT 18 14 - 54 U/L   Alkaline Phosphatase 82 38 - 126 U/L   Total Bilirubin 0.4 0.3 - 1.2 mg/dL   GFR calc non Af Amer >60 >60 mL/min   GFR calc Af Amer >60 >60 mL/min    Comment: (NOTE) The eGFR has been calculated using the CKD EPI  equation. This calculation has not been validated in all clinical situations. eGFR's persistently <60 mL/min signify possible Chronic Kidney Disease.    Anion gap 6 5 - 15  Uric acid     Status: None   Collection Time: 10/28/14 11:03 AM  Result Value Ref Range   Uric Acid, Serum 5.3 2.3 - 6.6 mg/dL    Treatments: labetalol and magnesium  Discharge Exam: Blood pressure 142/98, pulse 72, temperature 97.8 F (36.6 C), temperature source Oral, resp. rate 18, height _0  (1.803 m), weight 90.266 kg (199 lb), SpO2 98 %, unknown if currently breastfeeding.   Disposition: good, to home     Medication List    STOP taking these medications        diltiazem 120 MG tablet  Commonly known as:  CARDIZEM     glyBURIDE 2.5 MG tablet  Commonly known as:  DIABETA     prenatal vitamin w/FE, FA 27-1 MG Tabs tablet      TAKE these medications        butalbital-acetaminophen-caffeine 50-325-40 MG per tablet  Commonly known as:  FIORICET, ESGIC  Take 2 tablets by mouth every 4 (four) hours as needed for headache.      labetalol 200 MG tablet  Commonly known as:  NORMODYNE  Take 1 tablet (200 mg total) by mouth 3 (three) times daily.           Follow-up Information    Follow up with Marcial Pacas., MD In 1 week.   Specialty:  Obstetrics and Gynecology   Why:  bp check   Contact information:   [address] [place] [address] [phone]       Signed: Daria Pastures 10/29/2014, 5:28 PM

## 2014-10-30 ENCOUNTER — Inpatient Hospital Stay (HOSPITAL_COMMUNITY)
Admission: AD | Admit: 2014-10-30 | Discharge: 2014-11-01 | Disposition: A | Discharge status: Home / Self Care | Payer: 59 | Source: Ambulatory Visit | Attending: Obstetrics and Gynecology | Admitting: Obstetrics and Gynecology

## 2014-10-30 ENCOUNTER — Encounter (HOSPITAL_COMMUNITY): Payer: Self-pay | Admitting: *Deleted

## 2014-10-30 ENCOUNTER — Inpatient Hospital Stay (EMERGENCY_DEPARTMENT_HOSPITAL)
Admission: AD | Admit: 2014-10-30 | Discharge: 2014-10-30 | Disposition: A | Discharge status: Home / Self Care | Payer: 59 | Source: Ambulatory Visit | Attending: Obstetrics and Gynecology | Admitting: Obstetrics and Gynecology

## 2014-10-30 DIAGNOSIS — Z8249 Family history of ischemic heart disease and other diseases of the circulatory system: Secondary | ICD-10-CM

## 2014-10-30 DIAGNOSIS — Z87891 Personal history of nicotine dependence: Secondary | ICD-10-CM

## 2014-10-30 DIAGNOSIS — O165 Unspecified maternal hypertension, complicating the puerperium: Secondary | ICD-10-CM

## 2014-10-30 DIAGNOSIS — I158 Other secondary hypertension: Secondary | ICD-10-CM

## 2014-10-30 DIAGNOSIS — I169 Hypertensive crisis, unspecified: Secondary | ICD-10-CM

## 2014-10-30 DIAGNOSIS — O1495 Unspecified pre-eclampsia, complicating the puerperium: Secondary | ICD-10-CM | POA: Diagnosis present

## 2014-10-30 DIAGNOSIS — O9089 Other complications of the puerperium, not elsewhere classified: Secondary | ICD-10-CM | POA: Diagnosis present

## 2014-10-30 DIAGNOSIS — O169 Unspecified maternal hypertension, unspecified trimester: Secondary | ICD-10-CM

## 2014-10-30 DIAGNOSIS — Z833 Family history of diabetes mellitus: Secondary | ICD-10-CM

## 2014-10-30 MED ORDER — LABETALOL HCL 5 MG/ML IV SOLN
20.0000 mg | INTRAVENOUS | Status: AC | PRN
  Administered 2014-10-31: 20 mg via INTRAVENOUS
  Administered 2014-10-31: 40 mg via INTRAVENOUS
  Administered 2014-10-31: 20 mg via INTRAVENOUS
  Filled 2014-10-30: qty 4
  Filled 2014-10-30: qty 8

## 2014-10-30 MED ORDER — HYDRALAZINE HCL 20 MG/ML IJ SOLN
10.0000 mg | Freq: Once | INTRAMUSCULAR | Status: AC | PRN
  Administered 2014-10-31: 10 mg via INTRAVENOUS
  Filled 2014-10-30: qty 1

## 2014-10-30 MED ORDER — LACTATED RINGERS IV SOLN
INTRAVENOUS | Status: DC
  Administered 2014-10-31: 01:00:00 via INTRAVENOUS

## 2014-10-30 MED ORDER — ZOLPIDEM TARTRATE 5 MG PO TABS
5.0000 mg | ORAL_TABLET | Freq: Once | ORAL | Status: AC
  Administered 2014-10-31: 5 mg via ORAL
  Filled 2014-10-30: qty 1

## 2014-10-30 MED ORDER — PROMETHAZINE HCL 25 MG/ML IJ SOLN: 12.5000 mg | Freq: Once | INTRAMUSCULAR | Status: DC

## 2014-10-30 MED ORDER — ACETAMINOPHEN 325 MG PO TABS
325.0000 mg | ORAL_TABLET | Freq: Once | ORAL | Status: AC
  Administered 2014-10-31: 325 mg via ORAL
  Filled 2014-10-30: qty 1

## 2014-10-30 MED ORDER — LABETALOL HCL 100 MG PO TABS
100.0000 mg | ORAL_TABLET | Freq: Once | ORAL | Status: AC
  Administered 2014-10-30: 100 mg via ORAL
  Filled 2014-10-30: qty 1

## 2014-10-30 NOTE — MAU Provider Note (Signed)
History     CSN: [id number]  Arrival date and time: 10/30/14 1857   First Provider Initiated Contact with Patient 10/30/14 1923      Chief Complaint  Patient presents with  . Blood Pressure Check    HPI  Alexandra [name] is a 39 y.o. G3P1021 4 weeks post partum who presents for blood pressure check per her OB. She was discharged from hospital yesterday after being admitted for [facility] hypertensive crisis.  Today went to [facility] today to check BP, it was 181/119. Called Dr. Henderson Cloud who sent her to MAU for BP check.  Prescribed labetalol 200 mg TID; last dose taken today at 1630.  Denies headache, visual changes, edema, chest pain, or SOB.    Past Medical History  Diagnosis Date  . Frequent headaches   . Anemia   . PCOS (polycystic ovarian syndrome)   . Depression   . Sciatic pain   . Preterm labor   . Hypertension   . Vaginal Pap smear, abnormal   . Gestational diabetes     Past Surgical History  Procedure Laterality Date  . Dilation and curettage of uterus    . Colposcopy      Family History  Problem Relation Age of Onset  . Hypertension Mother   . Heart disease Mother   . Hyperlipidemia Mother   . Migraines Mother   . Heart disease Father   . Stomach cancer Father        . Hypertension Father   . Cancer Sister   . Diabetes Paternal Aunt   . Thyroid disease Other   . Migraines Brother   . Birth defects Brother     brain tumor    Social History  Substance Use Topics  . Smoking status: Former Smoker    Quit date: 01/17/2014  . Smokeless tobacco: Former Neurosurgeon    Quit date: 01/17/2014  . Alcohol Use: No     Comment: ocassionally- 2 per year    Allergies:  Allergies  Allergen Reactions  . Lortab [Hydrocodone-Acetaminophen] Itching    Prescriptions prior to admission  Medication Sig Dispense Refill Last Dose  . butalbital-acetaminophen-caffeine (FIORICET, ESGIC) 50-325-40 MG per tablet Take 2 tablets by mouth every 4 (four) hours as needed for headache.  14 tablet 0 PRN at PRN  . labetalol (NORMODYNE) 200 MG tablet Take 1 tablet (200 mg total) by mouth 3 (three) times daily. 90 tablet 4 10/30/2014 at 1630  . promethazine (PHENERGAN) 25 MG tablet Take 25 mg by mouth every 6 (six) hours as needed for nausea or vomiting.   10/30/2014 at Unknown time    Review of Systems  Constitutional: Negative.   HENT: Negative.   Eyes: Negative.  Negative for blurred vision.  Respiratory: Negative.   Cardiovascular: Negative.  Negative for chest pain.  Gastrointestinal: Negative.  Negative for heartburn, nausea and abdominal pain.  Neurological: Negative.  Negative for dizziness, seizures and headaches.   Physical Exam   Blood pressure 181/104, pulse 73, temperature 98.4 F (36.9 C), temperature source Oral, resp. rate 16, height 5\' 11"  (1.803 m), weight 90.266 kg (199 lb), last menstrual period 10/29/2014, unknown if currently breastfeeding.   Patient Vitals for the past 24 hrs:  BP Temp Temp src Pulse Resp Height Weight  10/30/14 2045 183/100 mmHg - - 64 - - -  10/30/14 2020 174/95 mmHg - - 63 - - -  10/30/14 2007 173/91 mmHg - - 63 - - -  10/30/14 1930 165/88 mmHg - - 74 - - -  10/30/14 1922 162/86 mmHg - - 72 - - -  10/30/14 1920 (!) 181/104 mmHg - - 73 - - -  10/30/14 1914 - 98.4 F (36.9 C) Oral - 16  (1.803 m) 90.266 kg (199 lb)      Physical Exam  Constitutional: She is oriented to person, place, and time. She appears well-developed and well-nourished. No distress.  HENT:  Head: Normocephalic and atraumatic.  Neck: Normal range of motion. Neck supple.  Cardiovascular: Normal rate, regular rhythm and normal heart sounds.   No murmur heard. Respiratory: Effort normal and breath sounds normal. No respiratory distress. She has no wheezes.  GI: Soft. Bowel sounds are normal.  Neurological: She is alert and oriented to person, place, and time. She has normal reflexes.  Reflex Scores:      Patellar reflexes are 2+ on the right side  and 2+ on the left side. Negative clonus  Skin: Skin is warm and dry. She is not diaphoretic.  Psychiatric: She has a normal mood and affect. Her behavior is normal. Judgment and thought content normal.   I have reviewed the past Medical Hx, Surgical Hx, Social Hx, Allergies and Medications.   MAU Course  Procedures  MDM 1940- C/w Dr. Henderson Cloud. Give 100 mg labetalol now. Increase rx labetalol to 300 mg TID. Discharge home if        BPs aren't within severe range.  Labetalol 100 mg given in MAU 2056-Dr. Henderson Cloud notified of persistent elevated BPs. Patient to be admitted to AICU 2100-Patient leaving AMA d/t childcare & transportation issues. States she will come back after she gets her baby. Dr. Henderson Cloud notified.   Assessment and Plan  A: 1. Postpartum hypertension    P: Pt to be admitted to [facility] but left AMA with plans to return for admission.   Judeth Horn 10/30/2014, 7:23 PM

## 2014-10-30 NOTE — MAU Note (Signed)
SPOKE  WITH PT  ABOUT  RETURNING   EVEN  THOUGH SHE FEELS  OK-    SHE SAYS SHE WILL RETURN AFTER  SHE GETS HER BABY SETTLED

## 2014-10-30 NOTE — MAU Note (Signed)
Patient was sent by Henderson Cloud for a blood pressure check today. States she delivered on 09/27/14. Went for [facility] office visit on Wednesday. Blood pressure was elevated in office and was admitted to [facility], treated and released yesterday. Denies discharge but started her menstrual cycle last night.

## 2014-10-30 NOTE — MAU Note (Signed)
Left earlier to get newborn from husband because he didn't have a car to drive here.  Feels fine.  Was here for blood pressure check.  Denies headache.

## 2014-10-31 ENCOUNTER — Other Ambulatory Visit: Payer: Self-pay

## 2014-10-31 ENCOUNTER — Inpatient Hospital Stay (HOSPITAL_COMMUNITY): Payer: 59

## 2014-10-31 ENCOUNTER — Encounter (HOSPITAL_COMMUNITY): Payer: Self-pay | Admitting: *Deleted

## 2014-10-31 DIAGNOSIS — I10 Essential (primary) hypertension: Secondary | ICD-10-CM

## 2014-10-31 LAB — COMPREHENSIVE METABOLIC PANEL
ALBUMIN: 3.3 g/dL — AB (ref 3.5–5.0)
ALK PHOS: 71 U/L (ref 38–126)
ALT: 19 U/L (ref 14–54)
AST: 23 U/L (ref 15–41)
Anion gap: 8 (ref 5–15)
BILIRUBIN TOTAL: 0.3 mg/dL (ref 0.3–1.2)
BUN: 9 mg/dL (ref 6–20)
CALCIUM: 8.5 mg/dL — AB (ref 8.9–10.3)
CHLORIDE: 107 mmol/L (ref 101–111)
CO2: 25 mmol/L (ref 22–32)
CREATININE: 0.77 mg/dL (ref 0.44–1.00)
GFR calc Af Amer: >60 mL/min (ref >60)
GFR calc non Af Amer: >60 mL/min (ref >60)
GLUCOSE: 81 mg/dL (ref 65–99)
POTASSIUM: 3.4 mmol/L — AB (ref 3.5–5.1)
SODIUM: 140 mmol/L (ref 135–145)
TOTAL PROTEIN: 6.4 g/dL — AB (ref 6.5–8.1)

## 2014-10-31 MED ORDER — HYDRALAZINE HCL 20 MG/ML IJ SOLN: 10.0000 mg | INTRAMUSCULAR | Status: DC | PRN

## 2014-10-31 MED ORDER — NIFEDIPINE ER 30 MG PO TB24
30.0000 mg | ORAL_TABLET | ORAL | Status: DC
  Administered 2014-10-31: 30 mg via ORAL
  Filled 2014-10-31: qty 1

## 2014-10-31 MED ORDER — SODIUM CHLORIDE 0.9 % IV SOLN: 250.0000 mL | INTRAVENOUS | Status: DC | PRN

## 2014-10-31 MED ORDER — NIFEDIPINE ER 60 MG PO TB24
60.0000 mg | ORAL_TABLET | Freq: Every day | ORAL | Status: DC
  Administered 2014-11-01: 60 mg via ORAL
  Filled 2014-10-31 (×2): qty 1

## 2014-10-31 MED ORDER — ACETAMINOPHEN 500 MG PO TABS
1000.0000 mg | ORAL_TABLET | Freq: Four times a day (QID) | ORAL | Status: DC | PRN
  Administered 2014-10-31: 1000 mg via ORAL
  Filled 2014-10-31: qty 2

## 2014-10-31 MED ORDER — BUTALBITAL-APAP-CAFFEINE 50-325-40 MG PO TABS
1.0000 | ORAL_TABLET | Freq: Three times a day (TID) | ORAL | Status: DC | PRN
  Administered 2014-11-01 (×2): 1 via ORAL
  Filled 2014-10-31 (×2): qty 1

## 2014-10-31 MED ORDER — BUTALBITAL-APAP-CAFFEINE 50-325-40 MG PO TABS
1.0000 | ORAL_TABLET | Freq: Three times a day (TID) | ORAL | Status: DC | PRN
  Administered 2014-10-31: 1 via ORAL
  Filled 2014-10-31: qty 1

## 2014-10-31 MED ORDER — SODIUM CHLORIDE 0.9 % IJ SOLN
3.0000 mL | Freq: Two times a day (BID) | INTRAMUSCULAR | Status: DC
  Administered 2014-10-31 – 2014-11-01 (×2): 3 mL via INTRAVENOUS

## 2014-10-31 MED ORDER — SODIUM CHLORIDE 0.9 % IJ SOLN
3.0000 mL | INTRAMUSCULAR | Status: DC | PRN
Start: 2014-10-31 — End: 2014-11-01
  Administered 2014-10-31: 3 mL via INTRAVENOUS
  Filled 2014-10-31: qty 3

## 2014-10-31 MED ORDER — NIFEDIPINE ER 30 MG PO TB24
30.0000 mg | ORAL_TABLET | Freq: Every day | ORAL | Status: DC
  Administered 2014-10-31: 30 mg via ORAL
  Filled 2014-10-31 (×2): qty 1

## 2014-10-31 MED ORDER — IBUPROFEN 800 MG PO TABS
800.0000 mg | ORAL_TABLET | Freq: Once | ORAL | Status: AC
  Administered 2014-10-31: 800 mg via ORAL
  Filled 2014-10-31: qty 1

## 2014-10-31 MED ORDER — LACTATED RINGERS IV SOLN
INTRAVENOUS | Status: AC
  Administered 2014-10-31: 10:00:00 via INTRAVENOUS

## 2014-10-31 NOTE — Progress Notes (Signed)
   10/31/14 0200  Vitals  Temp 98.5 F (36.9 C)  Temp Source Oral  BP 123/67 mmHg  MAP (mmHg) 81  BP Location Right Arm  BP Method Automatic  Patient Position (if appropriate) Lying  Pulse Rate 75  Pulse Rate Source Monitor  Resp 16  Oxygen Therapy  SpO2 98 %  O2 Device Room Air  Medication effective. Pt resting comfortable.

## 2014-10-31 NOTE — Progress Notes (Signed)
Pt transferred ambulatory to [facility] Rm #[id number]

## 2014-10-31 NOTE — Progress Notes (Signed)
   10/31/14 0130  Vitals  BP (!) 185/107 mmHg  MAP (mmHg) 127  BP Location Right Arm  BP Method Automatic  Patient Position (if appropriate) Sitting  Pulse Rate 74  Pulse Rate Source Monitor  Oxygen Therapy  SpO2 99 %  O2 Device Room Air  Hydralazine  IV administered as ordered. We will continue to monitor

## 2014-10-31 NOTE — Progress Notes (Signed)
   10/31/14 0100  Vitals  BP (!) 171/103 mmHg  MAP (mmHg) 120  BP Location Right Arm  BP Method Automatic  Patient Position (if appropriate) Sitting  Pulse Rate 69  Pulse Rate Source Monitor  Resp 18  Oxygen Therapy  SpO2 98 %  O2 Device Room Air  Labetalol  IV administered as ordered. We will continue to monitor.

## 2014-10-31 NOTE — Lactation Note (Signed)
Lactation Consultation Note  Patient Name: Karolyne Timmons [id number] Date: 10/31/2014   Mom 10 days post partum, readmitted to [facility] for MgSO4.  Set up DEBP at bedside and assisted Mom with pumping.  Mom very experienced with pumping.  Reviewed procedure and cleaning and storage guidelines.  Encouraged her to pump close to 8 times in 24 hrs if Mom with her baby. Storage bottles given.  Mom breast fed her baby 4 hrs ago, before FOB took baby home.  To follow up in am, and prn.     Judee Clara 10/31/2014, 12:03 PM

## 2014-10-31 NOTE — H&P (Signed)
Please refer to NP note below for full details.    39 y.o. G3P1021 at 10 days post partum uncomplicated NSVD readmitted for hypertensive crisis.  Pt had been seen at office on 8-11 and was found to have BPs 170-180/120s.  She was sent for eval at [facility] and eventually admitted for treatment.  She was placed on labetalol protocol and eventually switched to nifedipine.  On HD 2 she was placed on magnesium for 24 hours in case of atypical late onset preeclampsia and started on PO labetalol.  Over the next 36 hours her BPs stabilized and she only was symptomatic with Alexandra [name] while on magnesium.  By the evening of HD 2, her BPs were running consistently 140s/90s and sometimes lower, she was still assymptomatic and she desired to be able to take her infant to his pediatrician's appt the following am.    Last night, however, pt stated that she went to [facility] to check her BP and it was 170-180/110 despite taking her labetalol 200 mg in the am and second dose at 1630.  She called me and was instructed to return to [facility] for recheck.  Here her BPs are consistently 170-180/100-110s despite extra PO dose of labetalol and now labetalol protocol IV.  She has been switched to hydralazine IV and we will begin Nifedipine 30 mg XL as well.  She remains completely assymptomatic.  She has no prior history of HTN but does have significant family history of HTN and her father has heart disease.  Filed Vitals:   10/31/14 0100 10/31/14 0110 10/31/14 0120 10/31/14 0130  BP: 171/103 162/101 171/104 185/107  Pulse: 69 72 72 74  Temp:      TempSrc:      Resp: 18     SpO2: 98% 98% 99% 99%   Lungs CTA Cor RRR, no mrg Abd soft, nt, nd Ex no cords.  Labs to be done in am.  EKG NSR, normal  Urine at last admission was negative for protein, Urine P:Cr ratio negative and CMET normal.  Alexandra [name]/P  HTN/hypertensive crisis in the post partum period not directly related to pregnancy.  Pt already treated with magnesium last admission and  atypical preeclampsia is unlikely given normal labs.  1.  Will get CXR for cardiac silhouette and Renal US for blood flow later today.  2.  Will consult cardiology for further workup and treatment recommendations and f/u.  3.  Continue IV protocol for now; start nifedipine 30 mg XL.  Alexandra Alexandra [name] A   NP note: HPI  Alexandra Alexandra [name] is Alexandra [name] 39 y.o. G3P1021 4 weeks post partum who presents for blood pressure check per her OB. She was discharged from hospital yesterday after being admitted for [facility] hypertensive crisis.  Today went to [facility] today to check BP, it was 181/119. Called Dr. Henderson Cloud who sent her to MAU for BP check.  Prescribed labetalol 200 mg TID; last dose taken today at 1630.  Denies headache, visual changes, edema, chest pain, or SOB.    Past Medical History  Diagnosis Date  . Frequent headaches   . Anemia   . PCOS (polycystic ovarian syndrome)   . Depression   . Sciatic pain   . Preterm labor   . Hypertension   . Vaginal Pap smear, abnormal   . Gestational diabetes     Past Surgical History  Procedure Laterality Date  . Dilation and curettage of uterus    . Colposcopy      Family History  Problem Relation  Age of Onset  . Hypertension Mother   . Heart disease Mother   . Hyperlipidemia Mother   . Migraines Mother   . Heart disease Father   . Stomach cancer Father       . Hypertension Father   . Cancer Sister   . Diabetes Paternal Aunt   . Thyroid disease Other   . Migraines Brother   . Birth defects Brother     brain tumor    Social History  Substance Use Topics  . Smoking status: Former Smoker    Quit date: 01/17/2014  . Smokeless tobacco: Former Neurosurgeon    Quit date: 01/17/2014  . Alcohol Use: No     Comment: ocassionally- 2 per year    Allergies:  Allergies  Allergen Reactions  . Lortab  [Hydrocodone-Acetaminophen] Itching    Prescriptions prior to admission  Medication Sig Dispense Refill Last Dose  . butalbital-acetaminophen-caffeine (FIORICET, ESGIC) 50-325-40 MG per tablet Take 2 tablets by mouth every 4 (four) hours as needed for headache. 14 tablet 0 PRN at PRN  . labetalol (NORMODYNE) 200 MG tablet Take 1 tablet (200 mg total) by mouth 3 (three) times daily. 90 tablet 4 10/30/2014 at 1630  . promethazine (PHENERGAN) 25 MG tablet Take 25 mg by mouth every 6 (six) hours as needed for nausea or vomiting.   10/30/2014 at Unknown time    Review of Systems  Constitutional: Negative.  HENT: Negative.  Eyes: Negative. Negative for blurred vision.  Respiratory: Negative.  Cardiovascular: Negative. Negative for chest pain.  Gastrointestinal: Negative. Negative for heartburn, nausea and abdominal pain.  Neurological: Negative. Negative for dizziness, seizures and headaches.   Physical Exam   Blood pressure 181/104, pulse 73, temperature 98.4 F (36.9 C), temperature source Oral, resp. rate 16, height 5\' 11"  (1.803 m), weight 90.266 kg (199 lb), last menstrual period 10/29/2014, unknown if currently breastfeeding.   Patient Vitals for the past 24 hrs:  BP Temp Temp src Pulse Resp Height Weight  10/30/14 2045 183/100 mmHg - - 64 - - -  10/30/14 2020 174/95 mmHg - - 63 - - -  10/30/14 2007 173/91 mmHg - - 63 - - -  10/30/14 1930 165/88 mmHg - - 74 - - -  10/30/14 1922 162/86 mmHg - - 72 - - -  10/30/14 1920 (!) 181/104 mmHg - - 73 - - -  10/30/14 1914 - 98.4 F (36.9 C) Oral - 16 5\' 11"  (1.803 m) 90.266 kg (199 lb)      Physical Exam  Constitutional: She is oriented to person, place, and time. She appears well-developed and well-nourished. No distress.  HENT:  Head: Normocephalic and atraumatic.  Neck: Normal range of motion. Neck supple.  Cardiovascular: Normal  rate, regular rhythm and normal heart sounds.  No murmur heard. Respiratory: Effort normal and breath sounds normal. No respiratory distress. She has no wheezes.  GI: Soft. Bowel sounds are normal.  Neurological: She is alert and oriented to person, place, and time. She has normal reflexes.  Reflex Scores:  Patellar reflexes are 2+ on the right side and 2+ on the left side. Negative clonus  Skin: Skin is warm and dry. She is not diaphoretic.  Psychiatric: She has Alexandra [name] normal mood and affect. Her behavior is normal. Judgment and thought content normal.   I have reviewed the past Medical Hx, Surgical Hx, Social Hx, Allergies and Medications.   MAU Course  Procedures  MDM 1940- C/w Dr. Henderson Cloud. Give 100 mg labetalol now.  Increase rx labetalol to 300 mg TID. Discharge home if   BPs aren't within severe range.  Labetalol 100 mg given in MAU 2056-Dr. Henderson Cloud notified of persistent elevated BPs. Patient to be admitted to AICU 2100-Patient leaving AMA d/t childcare & transportation issues. States she will come back after she gets her baby. Dr. Henderson Cloud notified.  Assessment and Plan  Alexandra [name]: 1. Postpartum hypertension    P: Pt to be admitted to [facility] but left AMA with plans to return for admission.

## 2014-10-31 NOTE — Progress Notes (Signed)
   10/31/14 0120  Vitals  BP (!) 171/104 mmHg  MAP (mmHg) 120  BP Location Right Arm  BP Method Automatic  Patient Position (if appropriate) Sitting  Pulse Rate 72  Pulse Rate Source Monitor  Oxygen Therapy  SpO2 99 %  O2 Device Room Air  Labetalol  administered as ordered. We will continue to monitor.

## 2014-10-31 NOTE — Progress Notes (Signed)
  Patient is tolerating PO.  Has had a HA for several hours- tylenol did not help.  No other neurologic or preeclampsia signs.   Filed Vitals:   10/31/14 0500 10/31/14 0600 10/31/14 0700 10/31/14 0755  BP: 144/93 131/76  134/80  Pulse: 74 70 67 80  Temp:    98.2 F (36.8 C)  TempSrc:    Oral  Resp:    20  Height:      Weight:      SpO2: 100% 98% 98% 98%    lungs:   clear to auscultation cor:    RRR Abdomen:  soft, appropriate tenderness, incisions intact and without erythema or exudate ex:    no cords   Results for orders placed or performed during the hospital encounter of 10/30/14 (from the past 24 hour(s))  Comprehensive metabolic panel     Status: Abnormal   Collection Time: 10/31/14  6:15 AM  Result Value Ref Range   Sodium 140 135 - 145 mmol/L   Potassium 3.4 (L) 3.5 - 5.1 mmol/L   Chloride 107 101 - 111 mmol/L   CO2 25 22 - 32 mmol/L   Glucose, Bld 81 65 - 99 mg/dL   BUN 9 6 - 20 mg/dL   Creatinine, Ser 5.62 0.44 - 1.00 mg/dL   Calcium 8.5 (L) 8.9 - 10.3 mg/dL   Total Protein 6.4 (L) 6.5 - 8.1 g/dL   Albumin 3.3 (L) 3.5 - 5.0 g/dL   AST 23 15 - 41 U/L   ALT 19 14 - 54 U/L   Alkaline Phosphatase 71 38 - 126 U/L   Total Bilirubin 0.3 0.3 - 1.2 mg/dL   GFR calc non Af Amer >60 >60 mL/min   GFR calc Af Amer >60 >60 mL/min   Anion gap 8 5 - 15     A/P  HTN/hypertensive crisis PP day 11 with no signs of preeclampsia.   1.  Cardiology contacted and will consult. 2.  Renal US and CXR results pending. 3.  K+ slightly low, will defer to cards. 4.  Pt does not have a PCP outside of Ob/Gyn. 5.  Pt given ibuprofen for HA- still not better, Fiorcet given.

## 2014-10-31 NOTE — Consult Note (Signed)
Consulting cardiologist: Dr. Jonelle Sidle  Reason for consultation: Assistance with blood pressure management  Clinical Summary Alexandra [name] is a 39 y.o.female now 10 days postpartum status post uncomplicated vaginal delivery. She has had recent assessment for hypertensive urgency with significant blood pressure elevations, systolic in the 180s and diastolic as high as the 120s. She has been managed with labetalol, was also on magnesium during recent hospital assessment for possible atypical late onset preeclampsia, although this was not necessarily felt to be the case. She presents back to the hospital with continued elevations in blood pressure despite outpatient oral labetalol, now placed on nifedipine XL 30 mg daily, and also received additional as needed intravenous doses of hydralazine and labetalol. Blood pressure initially much better controlled earlier in the day, although beginning to increase. Last dose of nifedipine XL 30 mg was early in the morning.  She does have a history of mildly elevated blood pressure during pregnancy. She states that her father has high blood pressure and is on lisinopril. She also reports having problems with recurring headaches, describes them as migraines. She states that she was treated with labetalol and also verapamil for headaches at one point. These have been worse during her recent pregnancy.  Otherwise, she does not report any chest pain, breathlessness, orthopnea or PND, recent leg edema. Chest x-ray and abdominal ultrasound are normal as outlined below, as is her ECG. Creatinine normal range. Urinalysis largely unremarkable, negative protein.   Allergies  Allergen Reactions  . Lortab [Hydrocodone-Acetaminophen] Itching    Medications Scheduled Medications: . NIFEdipine  30 mg Oral Daily  . promethazine  12.5 mg Intravenous Once  . zolpidem  5 mg Oral Once     Infusions: . lactated ringers 10 mL/hr at 10/31/14 1236     PRN  Medications:  acetaminophen, butalbital-acetaminophen-caffeine   Past Medical History  Diagnosis Date  . Frequent headaches   . Anemia   . PCOS (polycystic ovarian syndrome)   . Depression   . Sciatic pain   . Preterm labor   . Hypertension   . Vaginal Pap smear, abnormal   . Gestational diabetes     Past Surgical History  Procedure Laterality Date  . Dilation and curettage of uterus    . Colposcopy      Family History  Problem Relation Age of Onset  . Hypertension Mother   . Heart disease Mother   . Hyperlipidemia Mother   . Migraines Mother   . Heart disease Father   . Stomach cancer Father        . Hypertension Father   . Cancer Sister   . Diabetes Paternal Aunt   . Thyroid disease Other   . Migraines Brother   . Birth defects Brother     brain tumor    Social History Alexandra [name] reports that she quit smoking about 9 months ago. Her smoking use included Cigarettes. She quit smokeless tobacco use about 9 months ago. Alexandra [name] reports that she does not drink alcohol.  Review of Systems Complete review of systems negative except as otherwise outlined in the clinical summary and also the following. One episode of bladder incontinence since delivery.  Physical Examination Blood pressure 152/101, pulse 82, temperature 97.9 F (36.6 C), temperature source Oral, resp. rate 18, height  (1.803 m), weight 195 lb 9.6 oz (88.724 kg), last menstrual period 10/29/2014, SpO2 99 %, unknown if currently breastfeeding.  Intake/Output Summary (Last 24 hours) at 10/31/14 1415 Last data filed  at 10/31/14 1236  Gross per 24 hour  Intake 1019.92 ml  Output   1150 ml  Net -130.08 ml   Gen.: Appears comfortable at rest. HEENT: Conjunctiva and lids normal, oropharynx clear. Neck: Supple, no elevated JVP or carotid bruits, no thyromegaly. Lungs: Clear to auscultation, nonlabored breathing at rest. Cardiac: Regular rate and rhythm, no S3 or significant systolic murmur,  no pericardial rub. Abdomen: Soft, nontender, bowel sounds present. Extremities: No pitting edema, distal pulses 2+. Skin: Warm and dry. Musculoskeletal: No kyphosis. Neuropsychiatric: Alert and oriented x3, affect grossly appropriate.  Lab Results  Basic Metabolic Panel:  Recent Labs Lab 10/27/14 1347 10/28/14 1103 10/31/14 0615  NA 138 142 140  K 3.5 3.2* 3.4*  CL 106 110 107  CO2 23 26 25   GLUCOSE 82 100* 81  BUN 7 7 9   CREATININE 0.77 0.76 0.77  CALCIUM 9.0 8.6* 8.5*    Liver Function Tests:  Recent Labs Lab 10/27/14 1347 10/28/14 1103 10/31/14 0615  AST 22 18 23   ALT 19 18 19   ALKPHOS 78 82 71  BILITOT 0.7 0.4 0.3  PROT 7.1 7.1 6.4*  ALBUMIN 3.8 3.8 3.3*    CBC:  Recent Labs Lab 10/27/14 1347 10/28/14 1103  WBC 7.4 8.1  NEUTROABS  --  5.1  HGB 12.7 12.7  HCT 39.1 39.2  MCV 83.2 83.4  PLT 256 272    ECG Tracing from 10/31/2014 showed normal sinus rhythm.  Imaging Chest x-ray 10/31/2014: FINDINGS: Cardiomediastinal silhouette is stable. No acute infiltrate or pleural effusion. No pulmonary edema. Bony thorax is unremarkable.  IMPRESSION: No active cardiopulmonary disease.  Abdominal ultrasound 10/31/2014: IMPRESSION: Normal sonographic appearance of the kidneys and bladder.  Impression  Uncontrolled hypertension with episodes of hypertensive urgency. Patient reports hypertension during pregnancy, although relatively mild and not on standing medication. Unclear to me whether headaches are related, particularly with reported history of migraines - she actually states that her headaches tend to be better when her blood pressure is high rather than controlled. Chest x-ray shows no acute findings and she does not report any symptoms to suggest cardiomyopathy or heart failure. ECG is also normal. Creatinine normal and urinalysis without protein.   Recommendations  Patient has been placed on nifedipine XL 30 mg daily, a reasonable choice for now.  Would change dose to 30 mg twice daily, will give her an extra dose this afternoon since blood pressure is trending back up. This may eventually be able to be simplified to 60 mg once daily dosing if adequate. Otherwise consideration of an additional agent such as HCTZ.   Jonelle Sidle, M.D., F.A.C.C.

## 2014-11-01 DIAGNOSIS — O1490 Unspecified pre-eclampsia, unspecified trimester: Secondary | ICD-10-CM

## 2014-11-01 DIAGNOSIS — O1495 Unspecified pre-eclampsia, complicating the puerperium: Secondary | ICD-10-CM | POA: Diagnosis present

## 2014-11-01 LAB — MRSA PCR SCREENING: MRSA by PCR: NEGATIVE

## 2014-11-01 MED ORDER — NIFEDIPINE ER 60 MG PO TB24: 60.0000 mg | ORAL_TABLET | Freq: Every day | ORAL | Status: DC

## 2014-11-01 MED ORDER — LABETALOL HCL 200 MG PO TABS
200.0000 mg | ORAL_TABLET | Freq: Three times a day (TID) | ORAL | Status: DC
  Administered 2014-11-01 (×2): 200 mg via ORAL
  Filled 2014-11-01 (×2): qty 1

## 2014-11-01 NOTE — Progress Notes (Signed)
Dr. Claiborne Billings notified of patients desire to be discharged. Updated on patient's blood pressures, and headache that patient thinks is related to labetalol. Will put in order for discharge and instruct patient to follow up in office at the end of this week. Will continue to monitor.

## 2014-11-01 NOTE — Discharge Summary (Signed)
  Pt admitted for postpartum hypertension.  She had received postpartum MgSO4 prior to discharge so at this admission was not given second infusion.  CXR performed and EKG unremarkable.  Cardiology was consulted and pt was advised to start Procardia XL  daily, and as of today cardiology recommended restarting her Labetalol 200tid.  Cardiologist Dr. Kyung Bacca I spoke with this am stated that discharge today was fine. BPs have remained normal and mild range.  Will have pt continue medications at discharge and f/u in the office within 1 week.

## 2014-11-01 NOTE — Progress Notes (Signed)
Patient discharged home with significant other.. Discharge instructions reviewed with patient and she verbalized understanding... Condition stable... No equipment... Ambulated to car with J. Bass, NT. 

## 2014-11-01 NOTE — Progress Notes (Signed)
Patient ID: Alexandra [name], female   DOB: 1975/03/30, 39 y.o.   MRN: [id number] Patient in shower Nurse indicates patient doing well without headache Was on labatolol 200 tid prior to admission  Now just on Procardia XL 60 mg BP improved  Would have her take her procardia and one dose of labatolol in am and remainder of labatolol  In afternoon and evening  Charlton Haws

## 2014-12-28 ENCOUNTER — Telehealth: Payer: Self-pay | Admitting: *Deleted

## 2014-12-28 ENCOUNTER — Ambulatory Visit: Payer: 59 | Admitting: Neurology

## 2014-12-28 NOTE — Telephone Encounter (Signed)
No showed follow up appointment. 

## 2015-01-06 ENCOUNTER — Ambulatory Visit (INDEPENDENT_AMBULATORY_CARE_PROVIDER_SITE_OTHER): Payer: 59 | Admitting: Family

## 2015-01-06 ENCOUNTER — Other Ambulatory Visit (INDEPENDENT_AMBULATORY_CARE_PROVIDER_SITE_OTHER): Payer: 59

## 2015-01-06 ENCOUNTER — Encounter: Payer: Self-pay | Admitting: Family

## 2015-01-06 VITALS — BP 120/82 | HR 78 | Temp 97.9°F | Resp 18 | Ht 71.0 in | Wt 196.0 lb

## 2015-01-06 DIAGNOSIS — F53 Postpartum depression: Secondary | ICD-10-CM | POA: Insufficient documentation

## 2015-01-06 DIAGNOSIS — Z8632 Personal history of gestational diabetes: Secondary | ICD-10-CM

## 2015-01-06 DIAGNOSIS — I10 Essential (primary) hypertension: Secondary | ICD-10-CM | POA: Diagnosis not present

## 2015-01-06 DIAGNOSIS — O24419 Gestational diabetes mellitus in pregnancy, unspecified control: Secondary | ICD-10-CM | POA: Insufficient documentation

## 2015-01-06 DIAGNOSIS — O99345 Other mental disorders complicating the puerperium: Secondary | ICD-10-CM

## 2015-01-06 DIAGNOSIS — K219 Gastro-esophageal reflux disease without esophagitis: Secondary | ICD-10-CM

## 2015-01-06 LAB — BASIC METABOLIC PANEL
BUN: 11 mg/dL (ref 6–23)
CO2: 24 meq/L (ref 19–32)
Calcium: 9 mg/dL (ref 8.4–10.5)
Chloride: 108 mEq/L (ref 96–112)
Creatinine, Ser: 0.76 mg/dL (ref 0.40–1.20)
GFR: 108.8 mL/min (ref >60.00)
GLUCOSE: 109 mg/dL — AB (ref 70–99)
POTASSIUM: 3.8 meq/L (ref 3.5–5.1)
SODIUM: 139 meq/L (ref 135–145)

## 2015-01-06 LAB — HEMOGLOBIN A1C: Hgb A1c MFr Bld: 5.8 % (ref 4.6–6.5)

## 2015-01-06 MED ORDER — OMEPRAZOLE 40 MG PO CPDR: 40.0000 mg | DELAYED_RELEASE_CAPSULE | Freq: Every day | ORAL | Status: DC

## 2015-01-06 MED ORDER — BUPROPION HCL ER (XL) 150 MG PO TB24: 150.0000 mg | ORAL_TABLET | Freq: Every day | ORAL | Status: DC

## 2015-01-06 NOTE — Assessment & Plan Note (Signed)
Previously maintained on over-the-counter medications which provided minimal relief. Start omeprazole 40 mg daily. Follow-up in one month to determine effectiveness.

## 2015-01-06 NOTE — Assessment & Plan Note (Signed)
Hypertension is well controlled with current regimen of labetalol and nifedipine. Does not currently monitor blood pressure at home. Does have significant family history for hypertension which may require lifelong medication. Continue current dosage of nifedipine and labetalol.

## 2015-01-06 NOTE — Assessment & Plan Note (Signed)
History of gestational diabetes and maintained on glyburide which she had self discontinued. Obtain A1c to check current status for diabetes. Follow-up pending A1c results.

## 2015-01-06 NOTE — Patient Instructions (Signed)
Thank you for choosing [facility].  Summary/Instructions:  Your prescription(s) have been submitted to your pharmacy or been printed and provided for you. Please take as directed and contact our office if you believe you are having problem(s) with the medication(s) or have any questions.  Please stop by the lab on the basement level of the building for your blood work. Your results will be released to MyChart (or called to you) after review, usually within 72 hours after test completion. If any changes need to be made, you will be notified at that same time.  If your symptoms worsen or fail to improve, please contact our office for further instruction, or in case of emergency go directly to the emergency room at the closest medical facility.   Postpartum Depression and Baby Blues The postpartum period begins right after the birth of a baby. During this time, there is often a great amount of joy and excitement. It is also a time of many changes in the life of the parents. Regardless of how many times a mother gives birth, each child brings new challenges and dynamics to the family. It is not unusual to have feelings of excitement along with confusing shifts in moods, emotions, and thoughts. All mothers are at risk of developing postpartum depression or the "baby blues." These mood changes can occur right after giving birth, or they may occur many months after giving birth. The baby blues or postpartum depression can be mild or severe. Additionally, postpartum depression can go away rather quickly, or it can be a long-term condition.  CAUSES Raised hormone levels and the rapid drop in those levels are thought to be a main cause of postpartum depression and the baby blues. A number of hormones change during and after pregnancy. Estrogen and progesterone usually decrease right after the delivery of your baby. The levels of thyroid hormone and various cortisol steroids also rapidly drop. Other  factors that play a role in these mood changes include major life events and genetics.  RISK FACTORS If you have any of the following risks for the baby blues or postpartum depression, know what symptoms to watch out for during the postpartum period. Risk factors that may increase the likelihood of getting the baby blues or postpartum depression include:  Having a personal or family history of depression.   Having depression while being pregnant.   Having premenstrual mood issues or mood issues related to oral contraceptives.  Having a lot of life stress.   Having marital conflict.   Lacking a social support network.   Having a baby with special needs.   Having health problems, such as diabetes.  SIGNS AND SYMPTOMS Symptoms of baby blues include:  Brief changes in mood, such as going from extreme happiness to sadness.  Decreased concentration.   Difficulty sleeping.   Crying spells, tearfulness.   Irritability.   Anxiety.  Symptoms of postpartum depression typically begin within the first month after giving birth. These symptoms include:  Difficulty sleeping or excessive sleepiness.   Marked weight loss.   Agitation.   Feelings of worthlessness.   Lack of interest in activity or food.  Postpartum psychosis is a very serious condition and can be dangerous. Fortunately, it is rare. Displaying any of the following symptoms is cause for immediate medical attention. Symptoms of postpartum psychosis include:   Hallucinations and delusions.   Bizarre or disorganized behavior.   Confusion or disorientation.  DIAGNOSIS  A diagnosis is made by an evaluation of your  symptoms. There are no medical or lab tests that lead to a diagnosis, but there are various questionnaires that a health care provider may use to identify those with the baby blues, postpartum depression, or psychosis. Often, a screening tool called the [place] Postnatal Depression Scale is  used to diagnose depression in the postpartum period.  TREATMENT The baby blues usually goes away on its own in 1-2 weeks. Social support is often all that is needed. You will be encouraged to get adequate sleep and rest. Occasionally, you may be given medicines to help you sleep.  Postpartum depression requires treatment because it can last several months or longer if it is not treated. Treatment may include individual or group therapy, medicine, or both to address any social, physiological, and psychological factors that may play a role in the depression. Regular exercise, a healthy diet, rest, and social support may also be strongly recommended.  Postpartum psychosis is more serious and needs treatment right away. Hospitalization is often needed. HOME CARE INSTRUCTIONS  Get as much rest as you can. Nap when the baby sleeps.   Exercise regularly. Some women find yoga and walking to be beneficial.   Eat a balanced and nourishing diet.   Do little things that you enjoy. Have a cup of tea, take a bubble bath, read your favorite magazine, or listen to your favorite music.  Avoid alcohol.   Ask for help with household chores, cooking, grocery shopping, or running errands as needed. Do not try to do everything.   Talk to people close to you about how you are feeling. Get support from your partner, family members, friends, or other new moms.  Try to stay positive in how you think. Think about the things you are grateful for.   Do not spend a lot of time alone.   Only take over-the-counter or prescription medicine as directed by your health care provider.  Keep all your postpartum appointments.   Let your health care provider know if you have any concerns.  SEEK MEDICAL CARE IF: You are having a reaction to or problems with your medicine. SEEK IMMEDIATE MEDICAL CARE IF:  You have suicidal feelings.   You think you may harm the baby or someone else. MAKE SURE  YOU:  Understand these instructions.  Will watch your condition.  Will get help right away if you are not doing well or get worse.   This information is not intended to replace advice given to you by your health care provider. Make sure you discuss any questions you have with your health care provider.   Document Released: 12/08/2003 Document Revised: 03/10/2013 Document Reviewed: 12/15/2012 Elsevier Interactive Patient Education 2016 [employer].  Hypertension Hypertension, commonly called high blood pressure, is when the force of blood pumping through your arteries is too strong. Your arteries are the blood vessels that carry blood from your heart throughout your body. A blood pressure reading consists of a higher number over a lower number, such as 110/72. The higher number (systolic) is the pressure inside your arteries when your heart pumps. The lower number (diastolic) is the pressure inside your arteries when your heart relaxes. Ideally you want your blood pressure below 120/80. Hypertension forces your heart to work harder to pump blood. Your arteries may become narrow or stiff. Having untreated or uncontrolled hypertension can cause heart attack, stroke, kidney disease, and other problems. RISK FACTORS Some risk factors for high blood pressure are controllable. Others are not.  Risk factors you cannot  control include:   Race. You may be at higher risk if you are African American.  Age. Risk increases with age.  Gender. Men are at higher risk than women before age 53 years. After age 22, women are at higher risk than men. Risk factors you can control include:  Not getting enough exercise or physical activity.  Being overweight.  Getting too much fat, sugar, calories, or salt in your diet.  Drinking too much alcohol. SIGNS AND SYMPTOMS Hypertension does not usually cause signs or symptoms. Extremely high blood pressure (hypertensive crisis) may cause headache, anxiety,  shortness of breath, and nosebleed. DIAGNOSIS To check if you have hypertension, your health care provider will measure your blood pressure while you are seated, with your arm held at the level of your heart. It should be measured at least twice using the same arm. Certain conditions can cause a difference in blood pressure between your right and left arms. A blood pressure reading that is higher than normal on one occasion does not mean that you need treatment. If it is not clear whether you have high blood pressure, you may be asked to return on a different day to have your blood pressure checked again. Or, you may be asked to monitor your blood pressure at home for 1 or more weeks. TREATMENT Treating high blood pressure includes making lifestyle changes and possibly taking medicine. Living a healthy lifestyle can help lower high blood pressure. You may need to change some of your habits. Lifestyle changes may include:  Following the DASH diet. This diet is high in fruits, vegetables, and whole grains. It is low in salt, red meat, and added sugars.  Keep your sodium intake below 2,300 mg per day.  Getting at least 30-45 minutes of aerobic exercise at least 4 times per week.  Losing weight if necessary.  Not smoking.  Limiting alcoholic beverages.  Learning ways to reduce stress. Your health care provider may prescribe medicine if lifestyle changes are not enough to get your blood pressure under control, and if one of the following is true:  You are 63-35 years of age and your systolic blood pressure is above 140.  You are 32 years of age or older, and your systolic blood pressure is above 150.  Your diastolic blood pressure is above 90.  You have diabetes, and your systolic blood pressure is over 140 or your diastolic blood pressure is over 90.  You have kidney disease and your blood pressure is above 140/90.  You have heart disease and your blood pressure is above 140/90. Your  personal target blood pressure may vary depending on your medical conditions, your age, and other factors. HOME CARE INSTRUCTIONS  Have your blood pressure rechecked as directed by your health care provider.   Take medicines only as directed by your health care provider. Follow the directions carefully. Blood pressure medicines must be taken as prescribed. The medicine does not work as well when you skip doses. Skipping doses also puts you at risk for problems.  Do not smoke.   Monitor your blood pressure at home as directed by your health care provider. SEEK MEDICAL CARE IF:   You think you are having a reaction to medicines taken.  You have recurrent headaches or feel dizzy.  You have swelling in your ankles.  You have trouble with your vision. SEEK IMMEDIATE MEDICAL CARE IF:  You develop a severe headache or confusion.  You have unusual weakness, numbness, or feel faint.  You have severe chest or abdominal pain.  You vomit repeatedly.  You have trouble breathing. MAKE SURE YOU:   Understand these instructions.  Will watch your condition.  Will get help right away if you are not doing well or get worse.   This information is not intended to replace advice given to you by your health care provider. Make sure you discuss any questions you have with your health care provider.   Document Released: 03/05/2005 Document Revised: 07/20/2014 Document Reviewed: 12/26/2012 Elsevier Interactive Patient Education [employer]

## 2015-01-06 NOTE — Progress Notes (Signed)
Pre visit review using our clinic review tool, if applicable. No additional management support is needed unless otherwise documented below in the visit note.  Refused flu shot 

## 2015-01-06 NOTE — Assessment & Plan Note (Addendum)
Edinburgh depression inventory positive for possible postpartum depression. Denies suicidal or homicidal ideation. Does have adequate support structure around her. Declines counseling. Start Wellbutrin. Follow-up in one month or sooner if symptoms worsen or do not improve.

## 2015-01-06 NOTE — Progress Notes (Signed)
Subjective:    Patient ID: Alexandra [name], female    DOB: 1975/07/19, 39 y.o.   MRN: [id number]  Chief Complaint  Patient presents with  . Establish Care    Hypertension, anxiety and depression, just had a baby and was told she has all of those issues, needs to be checked for diabetes    HPI:  Alexandra [name] is a 39 y.o. female who  has a past medical history of Frequent headaches; Anemia; PCOS (polycystic ovarian syndrome); Depression; Preterm labor; Hypertension; Vaginal Pap smear, abnormal; Gestational diabetes; and Sciatic pain. and presents today for an office visit to establish care.  1.) Hypertension - Significant history of hypertension and hypertensive urgency during pregnancy and is currently maintained on nifedipine and labetolol. Takes the medications as prescribed and denies adverse side effects or hypertensive events. Denies signs/symptoms of end organ damage.  BP Readings from Last 3 Encounters:  01/06/15 120/82  11/01/14 121/94  10/30/14 183/100    2.) Gestational diabetes -  Diagnosed with gestational diabetes and started on the glyburide for treatment. She experienced increased symptoms on 2.5 mg and reduced dose to 1.25 mg. Notes that she has not taken the medications since she delivered in August.    3.) Anxiety and depression - Concern for postpartum depression and anxiety. Notes that she has been excessively protective and focused solely on her baby and has shut off a lot of her outside resources. Denies thoughts of suicide or harm to the baby. Edinburgh Depression Scale score is 21 indicating potential depression. Not currently taking medication. Reports sleeping about 2 hours per night and eating about 3 meals per day.   Allergies  Allergen Reactions  . Lortab [Hydrocodone-Acetaminophen] Itching     Outpatient Prescriptions Prior to Visit  Medication Sig Dispense Refill  . labetalol (NORMODYNE) 200 MG tablet Take 1 tablet (200 mg total) by mouth 3  (three) times daily. 90 tablet 4  . NIFEdipine (PROCARDIA-XL/ADALAT CC) 60 MG 24 hr tablet Take 1 tablet (60 mg total) by mouth daily at 12 noon. 30 tablet 2  . Prenatal Vit-Fe Fumarate-FA (PRENATAL MULTIVITAMIN) TABS tablet Take 1 tablet by mouth daily.    . butalbital-acetaminophen-caffeine (FIORICET, ESGIC) 50-325-40 MG per tablet Take 2 tablets by mouth every 4 (four) hours as needed for headache. 14 tablet 0   No facility-administered medications prior to visit.     Past Medical History  Diagnosis Date  . Frequent headaches   . Anemia   . PCOS (polycystic ovarian syndrome)   . Depression   . Preterm labor   . Hypertension   . Vaginal Pap smear, abnormal   . Gestational diabetes   . Sciatic pain      Past Surgical History  Procedure Laterality Date  . Dilation and curettage of uterus    . Colposcopy       Family History  Problem Relation Age of Onset  . Hypertension Mother   . Heart disease Mother   . Hyperlipidemia Mother   . Migraines Mother   . Heart disease Father   . Stomach cancer Father        . Hypertension Father   . Cancer Sister   . Diabetes Paternal Aunt   . Thyroid disease Other   . Migraines Brother   . Birth defects Brother     brain tumor     Social History   Social History  . Marital Status: Single    Spouse Name: N/A  . Number of Children:  1  . Years of Education: 16   Occupational History  . [employer] Other   Social History Main Topics  . Smoking status: Former Smoker -- 0.25 packs/day for 1.5 years    Types: Cigarettes    Quit date: 01/17/2014  . Smokeless tobacco: Former Neurosurgeon    Quit date: 01/17/2014  . Alcohol Use: No     Comment: Occasionally - 2 per year  . Drug Use: No  . Sexual Activity: Yes    Birth Control/ Protection: None   Other Topics Concern  . 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   Has a bachelor's degree.   Caffeine use: 2 cups of pepsi/day     Review of Systems    Constitutional: Negative for fever and chills.  Endocrine: Negative for polydipsia, polyphagia and polyuria.  Psychiatric/Behavioral: Negative for suicidal ideas.      Objective:    BP 120/82 mmHg  Pulse 78  Temp(Src) 97.9 F (36.6 C) (Oral)  Resp 18  Ht  (1.803 m)  Wt 196 lb (88.905 kg)  BMI 27.35 kg/m2  SpO2 97% Nursing note and vital signs reviewed.  Physical Exam  Constitutional: She is oriented to person, place, and time. She appears well-developed and well-nourished. No distress.  Cardiovascular: Normal rate, regular rhythm, normal heart sounds and intact distal pulses.   Pulmonary/Chest: Effort normal and breath sounds normal.  Neurological: She is alert and oriented to person, place, and time.  Skin: Skin is warm and dry.  Psychiatric: She has a normal mood and affect. Her behavior is normal. Judgment and thought content normal.       Assessment & Plan:   Problem List Items Addressed This Visit      Cardiovascular and Mediastinum   Essential hypertension - Primary    Hypertension is well controlled with current regimen of labetalol and nifedipine. Does not currently monitor blood pressure at home. Does have significant family history for hypertension which may require lifelong medication. Continue current dosage of nifedipine and labetalol.      Relevant Orders   Basic Metabolic Panel (BMET) (Completed)     Digestive   GERD (gastroesophageal reflux disease)    Previously maintained on over-the-counter medications which provided minimal relief. Start omeprazole 40 mg daily. Follow-up in one month to determine effectiveness.      Relevant Medications   omeprazole (PRILOSEC) 40 MG capsule     Endocrine   Gestational diabetes    History of gestational diabetes and maintained on glyburide which she had self discontinued. Obtain A1c to check current status for diabetes. Follow-up pending A1c results.        Other   Post partum depression    Edinburgh  depression inventory positive for possible postpartum depression. Denies suicidal or homicidal ideation. Does have adequate support structure around her. Declines counseling. Start Wellbutrin. Follow-up in one month or sooner if symptoms worsen or do not improve.      Relevant Medications   buPROPion (WELLBUTRIN XL) 150 MG 24 hr tablet

## 2015-01-12 ENCOUNTER — Telehealth: Payer: Self-pay

## 2015-01-12 DIAGNOSIS — K219 Gastro-esophageal reflux disease without esophagitis: Secondary | ICD-10-CM

## 2015-01-12 MED ORDER — OMEPRAZOLE 40 MG PO CPDR: 40.0000 mg | DELAYED_RELEASE_CAPSULE | Freq: Every day | ORAL | Status: AC

## 2015-01-21 ENCOUNTER — Telehealth: Payer: Self-pay

## 2015-01-21 NOTE — Telephone Encounter (Signed)
PA initiated via covermymeds. Key: [id number]

## 2015-01-31 ENCOUNTER — Telehealth: Payer: Self-pay | Admitting: Neurology

## 2015-01-31 NOTE — Telephone Encounter (Signed)
Received a call from amanda with dr Casimiro Needlemichael rater's office. They are independent reviewers for [employer]. They are calling to see if greg would like to comment on the patients psychological condition preventing her from working. If he would like to speak on this, she asks that we gi9ve a call back to schedule a 5 minute conversation. Their hours are 9-5 EST

## 2015-01-31 NOTE — Telephone Encounter (Signed)
Otelia SergeantAmanda B with Dr Rater (Psychiatrist) [facility] is requesting peer to peer regarding pt's disability. I explained Dr Lucia GaskinsAhern would be back in the office 02/07/15. He can be reached at [phone] and can call him upon your return

## 2015-01-31 NOTE — Telephone Encounter (Signed)
PA has been approved called pt and pharmacy to let them know.

## 2015-01-31 NOTE — Telephone Encounter (Signed)
Dr Dennie FettersFuks called with [employer] for a peer to peer regarding pt's disability. I explained Dr Lucia GaskinsAhern would be out of the office until 02/07/15. He understood. Please call him at [phone]

## 2015-02-01 NOTE — Telephone Encounter (Signed)
Emma - First of all, not sure we can talk to them without a release form from the patient? If we can speak with them, can you call back and let them know that I have not seen the patient since April so I have nothing to add. Further, I cannot comment on any psychological condition, she saw me for migraines not her psychological condition. thanks

## 2015-02-01 NOTE — Telephone Encounter (Signed)
LVM for pt to call back. Please let pt know we do not have a signed release form her stating we can speak with Marchelle FolksAmanda at Dr Tillie Fantasiaater's office. They are contacting our office to discuss things. She would have to fill out form for us to be able to take with them. Gave [facility] phone number and office hours.

## 2015-02-08 ENCOUNTER — Ambulatory Visit: Payer: 59 | Admitting: Family

## 2015-02-08 DIAGNOSIS — Z0289 Encounter for other administrative examinations: Secondary | ICD-10-CM

## 2015-02-16 ENCOUNTER — Encounter: Payer: Self-pay | Admitting: Family

## 2015-02-16 ENCOUNTER — Ambulatory Visit (INDEPENDENT_AMBULATORY_CARE_PROVIDER_SITE_OTHER): Payer: 59 | Admitting: Family

## 2015-02-16 VITALS — BP 160/110 | HR 81 | Temp 97.9°F | Resp 18 | Ht 71.0 in | Wt 195.1 lb

## 2015-02-16 DIAGNOSIS — F53 Postpartum depression: Secondary | ICD-10-CM

## 2015-02-16 DIAGNOSIS — O99345 Other mental disorders complicating the puerperium: Principal | ICD-10-CM

## 2015-02-16 DIAGNOSIS — Z309 Encounter for contraceptive management, unspecified: Secondary | ICD-10-CM | POA: Diagnosis not present

## 2015-02-16 DIAGNOSIS — Z30011 Encounter for initial prescription of contraceptive pills: Secondary | ICD-10-CM

## 2015-02-16 NOTE — Patient Instructions (Addendum)
Thank you for choosing [facility].  Summary/Instructions:  If your symptoms worsen or fail to improve, please contact our office for further instruction, or in case of emergency go directly to the emergency room at the closest medical facility.   Please continue your medications as prescribed.

## 2015-02-16 NOTE — Progress Notes (Signed)
Subjective:    Patient ID: Alexandra [name], female    DOB: 1976-03-19, 40 y.o.   MRN: [id number]  Chief Complaint  Patient presents with  . Follow-up    Blood pressure    HPI:  Crista Nuon is a 39 y.o. female who  has a past medical history of Frequent headaches; Anemia; PCOS (polycystic ovarian syndrome); Depression; Preterm labor; Hypertension; Vaginal Pap smear, abnormal; Gestational diabetes; and Sciatic pain. and presents today for an office follow up.  1.) Postpartum Depression - Recently started on wellbutrin. Notes that she does not feel any significant differences. She notes that she still continues to have crying spells and does have forgetfulness. She is currently seen by a psychologist who recommended continuing the medication. Denies any suicidal / homocidal ideations.   2.) Birth Control - Interested in starting a birth control and is interested in discussing her options. Does not want to do IUD or Nexplanon. Reports previous use of Depro Provera and oral contraception.    Allergies  Allergen Reactions  . Lortab [Hydrocodone-Acetaminophen] Itching     Current Outpatient Prescriptions on File Prior to Visit  Medication Sig Dispense Refill  . buPROPion (WELLBUTRIN XL) 150 MG 24 hr tablet Take 1 tablet (150 mg total) by mouth daily. 30 tablet 0  . labetalol (NORMODYNE) 200 MG tablet Take 1 tablet (200 mg total) by mouth 3 (three) times daily. 90 tablet 4  . NIFEdipine (PROCARDIA-XL/ADALAT CC) 60 MG 24 hr tablet Take 1 tablet (60 mg total) by mouth daily at 12 noon. 30 tablet 2  . omeprazole (PRILOSEC) 40 MG capsule Take 1 capsule (40 mg total) by mouth daily. 30 capsule 3  . Prenatal Vit-Fe Fumarate-FA (PRENATAL MULTIVITAMIN) TABS tablet Take 1 tablet by mouth daily.     No current facility-administered medications on file prior to visit.    Review of Systems  Constitutional: Negative for fever and chills.  Psychiatric/Behavioral: Positive for dysphoric mood.  Negative for suicidal ideas, sleep disturbance and self-injury. The patient is not nervous/anxious.       Objective:    BP 160/110 mmHg  Pulse 81  Temp(Src) 97.9 F (36.6 C) (Oral)  Resp 18  Ht  (1.803 m)  Wt 195 lb 1.9 oz (88.506 kg)  BMI 27.23 kg/m2  SpO2 98% Nursing note and vital signs reviewed.  Physical Exam  Constitutional: She is oriented to person, place, and time. She appears well-developed and well-nourished. No distress.  Dressed appropriately for the situation and is interacting with her baby appropriately and lovingly.   Cardiovascular: Normal rate, regular rhythm, normal heart sounds and intact distal pulses.   Pulmonary/Chest: Effort normal and breath sounds normal.  Neurological: She is alert and oriented to person, place, and time.  Skin: Skin is warm and dry.  Psychiatric: She has a normal mood and affect. Her behavior is normal. Judgment and thought content normal.       Assessment & Plan:   Problem List Items Addressed This Visit      Other   Post partum depression    Post partem depression with no signifcant improvement with medication. She continues to attend counseling weekly. Denies suicidal or homicidal ideation. Continue current dosage buproprion and counseling. Follow up if symptoms worsen or seek care if thoughts of suicide develop.       Encounter for BCP (birth control pills) initial prescription - Primary    Patient would like to start depo-provera. The office is currently out of medication. Patient will  schedule a follow up nurse visit for pregnancy test and depo-provera injection.

## 2015-02-16 NOTE — Progress Notes (Signed)
Pre visit review using our clinic review tool, if applicable. No additional management support is needed unless otherwise documented below in the visit note. 

## 2015-02-16 NOTE — Assessment & Plan Note (Signed)
Patient would like to start depo-provera. The office is currently out of medication. Patient will schedule a follow up nurse visit for pregnancy test and depo-provera injection.

## 2015-02-16 NOTE — Assessment & Plan Note (Signed)
Post partem depression with no signifcant improvement with medication. She continues to attend counseling weekly. Denies suicidal or homicidal ideation. Continue current dosage buproprion and counseling. Follow up if symptoms worsen or seek care if thoughts of suicide develop.

## 2015-02-21 ENCOUNTER — Other Ambulatory Visit: Payer: Self-pay

## 2015-02-22 ENCOUNTER — Other Ambulatory Visit: Payer: Self-pay

## 2015-02-22 MED ORDER — NIFEDIPINE ER 60 MG PO TB24: 60.0000 mg | ORAL_TABLET | Freq: Every day | ORAL | Status: AC

## 2015-03-02 ENCOUNTER — Telehealth: Payer: Self-pay | Admitting: Family

## 2015-03-02 DIAGNOSIS — F53 Postpartum depression: Secondary | ICD-10-CM

## 2015-03-02 DIAGNOSIS — O99345 Other mental disorders complicating the puerperium: Principal | ICD-10-CM

## 2015-03-02 MED ORDER — BUPROPION HCL ER (XL) 150 MG PO TB24: 150.0000 mg | ORAL_TABLET | Freq: Every day | ORAL | Status: AC

## 2015-03-02 NOTE — Telephone Encounter (Signed)
Patient is requesting a refill for   buPROPion (WELLBUTRIN XL) 150 MG 24 hr tablet [[id number]][[id number]]      Sent to [facility] on [address]

## 2015-03-02 NOTE — Telephone Encounter (Signed)
Medication refilled

## 2015-03-07 NOTE — Telephone Encounter (Signed)
erroneous

## 2015-12-03 IMAGING — US US OB COMP LESS 14 WK
1 series · 14 of 28 positions shown · non-contrast
Comparison: None

CLINICAL DATA: Early pregnancy, vaginal bleeding, spotting, and
12/26/2013

EXAM:
OBSTETRIC <14 WK US AND TRANSVAGINAL OB US
TECHNIQUE: Both transabdominal and transvaginal ultrasound examinations were
performed for complete evaluation of the gestation as well as the
maternal uterus, adnexal regions, and pelvic cul-de-sac.
Transvaginal technique was performed to assess early pregnancy.

[Series 1: us ob comp less 14 wks · 14 of 49 slices shown]
[im 2/49]
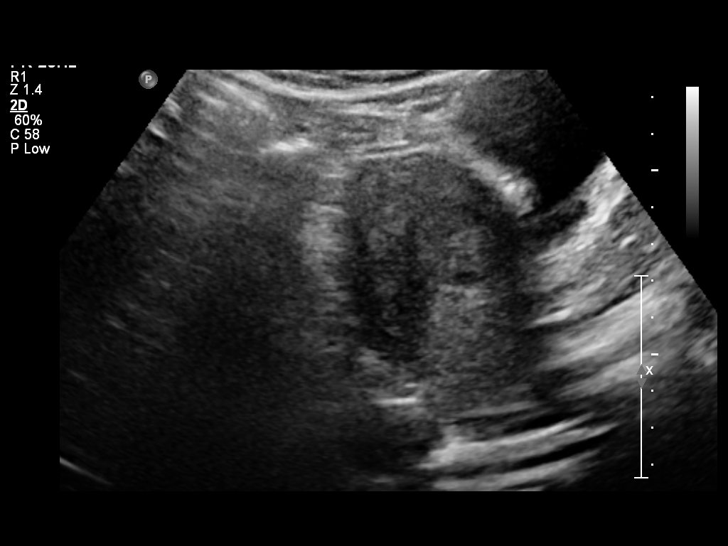
[im 6/49]
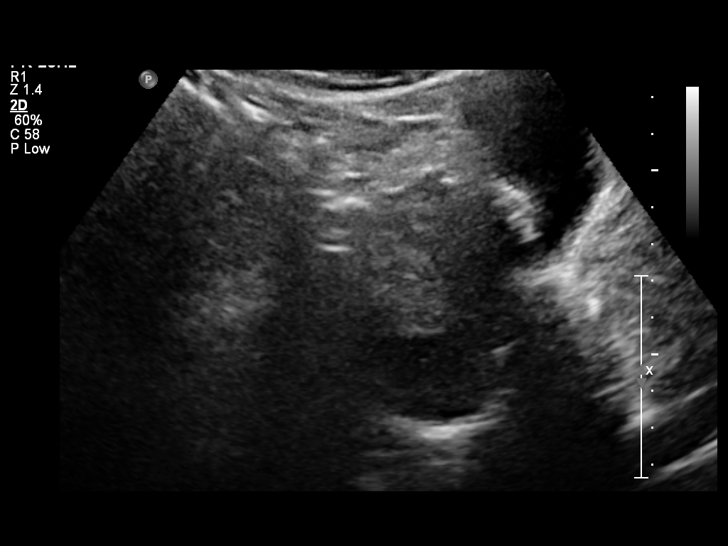
[im 9/49]
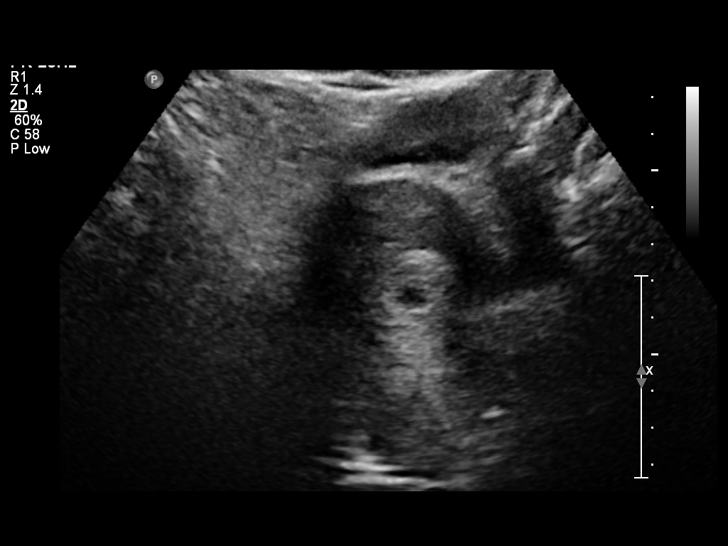
[im 13/49]
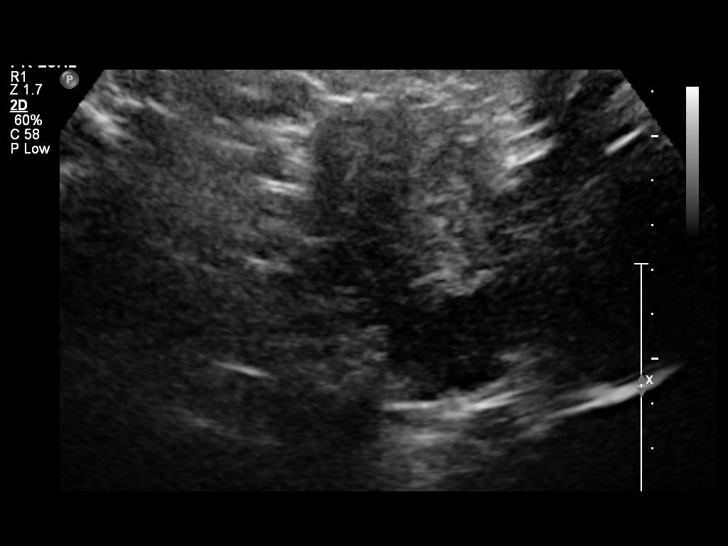
[im 17/49]
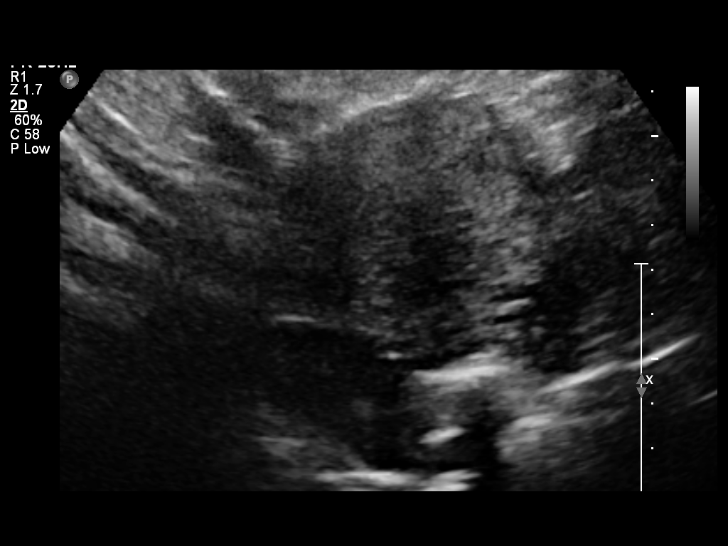
[im 20/49]
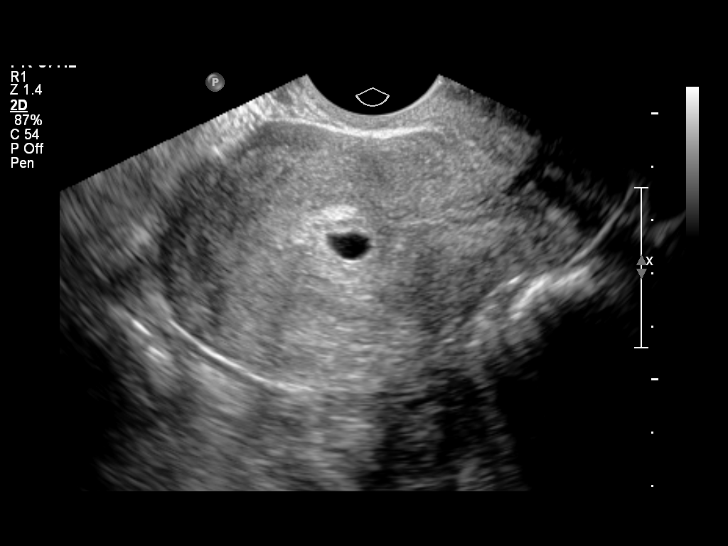
[im 24/49]
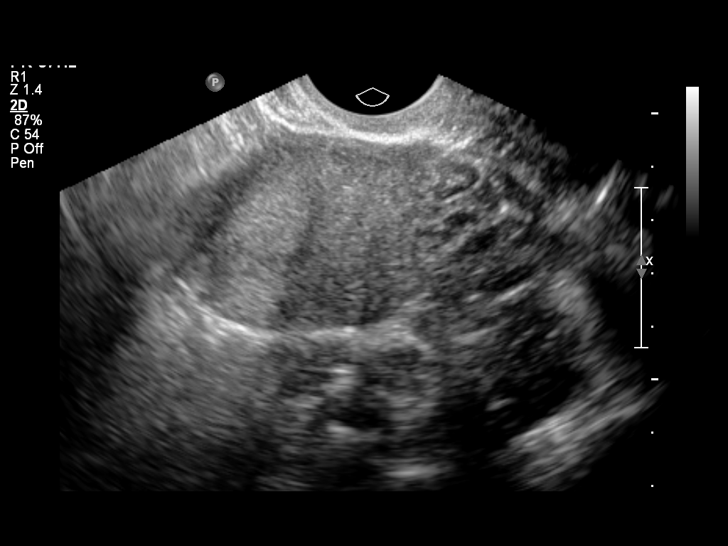
[im 27/49]
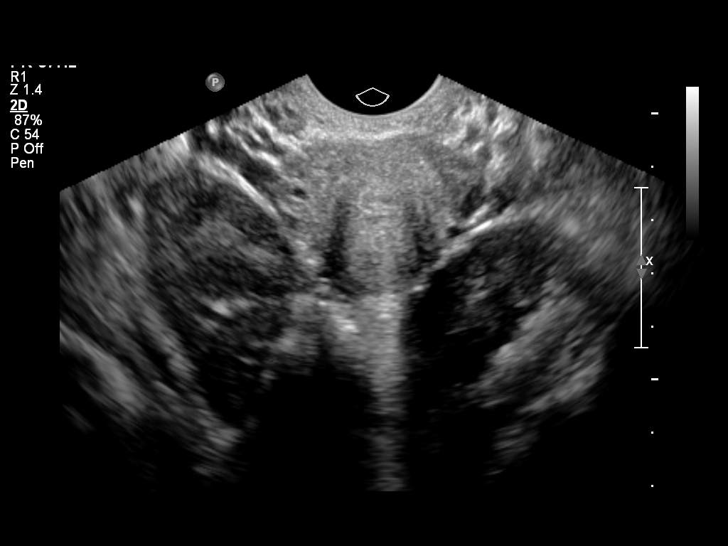
[im 31/49]
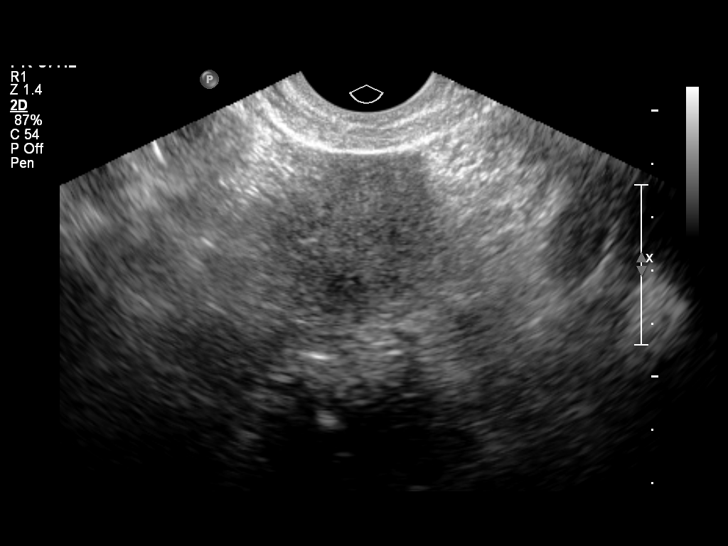
[im 34/49]
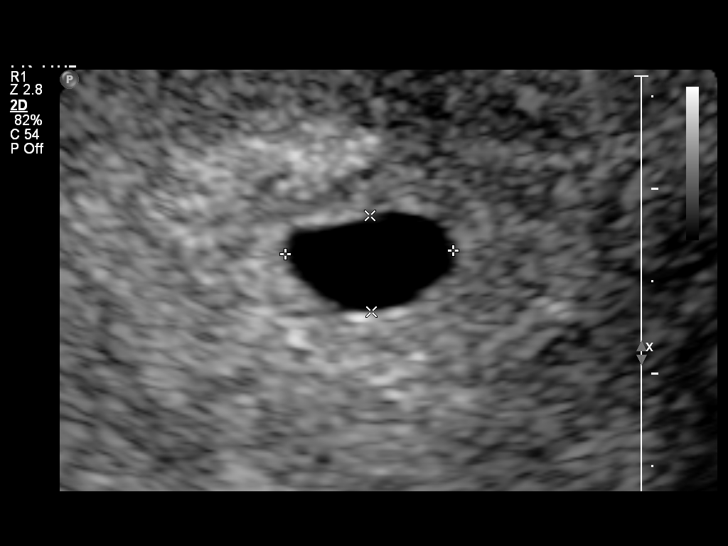
[im 38/49]
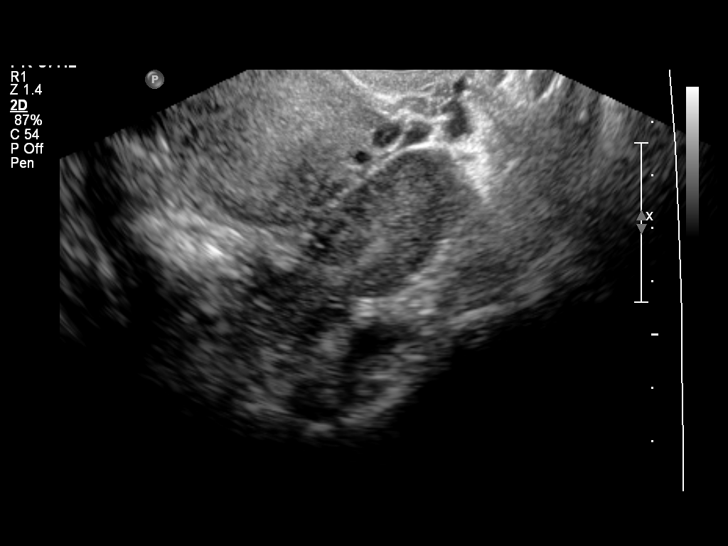
[im 41/49]
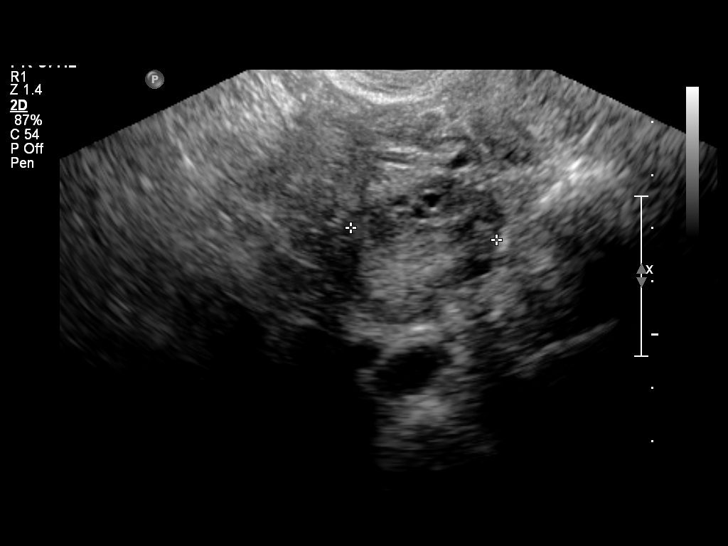
[im 45/49]
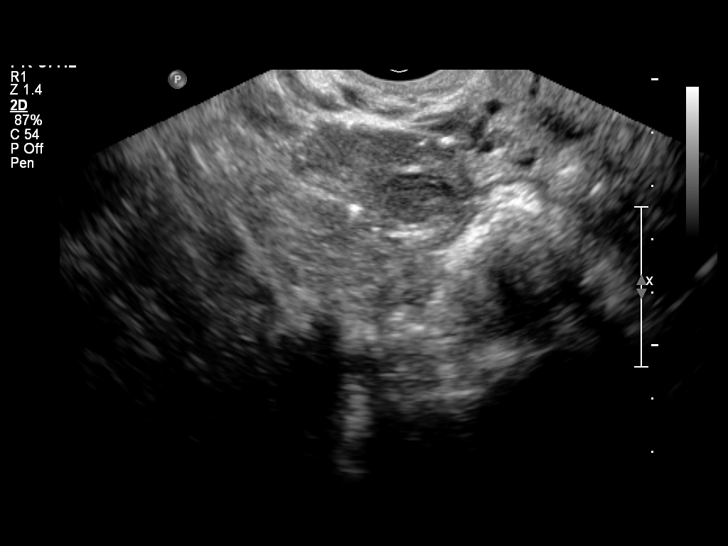
[im 49/49]
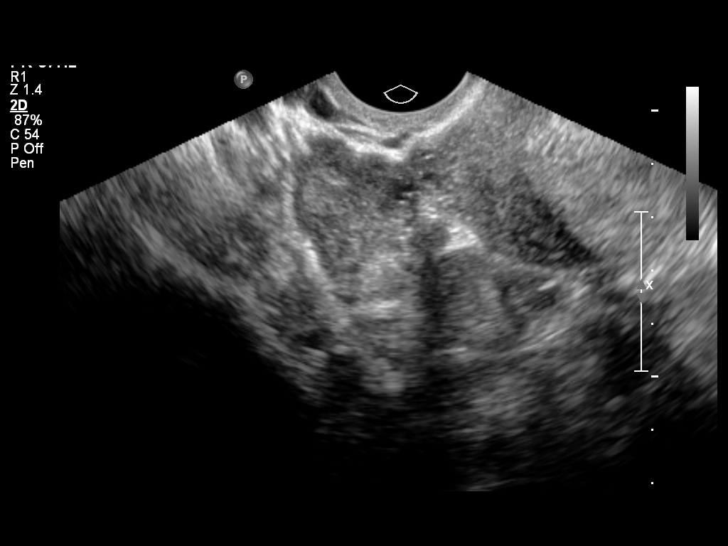

[14 of 28 positions shown; findings below may reference images not displayed]

FINDINGS: Intrauterine gestational sac: Visualized/normal in shape.

Yolk sac:  Present

Embryo:  Not identified

Cardiac Activity: N/A

Heart Rate:  N/A bpm

MSD:  8.1  mm   5 w   3  d              US EDC: 10/03/2014

Maternal uterus/adnexae:

No subchorionic hemorrhage.

LEFT ovary normal size and morphology, 5.2 x 2.2 x 2.8 cm.

RIGHT ovary normal size and morphology, 4.5 x 2.8 x 2.7 cm.

No adnexal masses or free pelvic fluid.
IMPRESSION: Intrauterine gestational sac containing a yolk sac but no fetal pole
identified, unable to establish viability.

No other pelvic abnormality seen.

## 2016-03-14 ENCOUNTER — Emergency Department (HOSPITAL_COMMUNITY): Payer: 59

## 2016-03-14 ENCOUNTER — Encounter (HOSPITAL_COMMUNITY): Payer: Self-pay

## 2016-03-14 ENCOUNTER — Emergency Department (HOSPITAL_COMMUNITY)
Admission: EM | Admit: 2016-03-14 | Discharge: 2016-03-14 | Disposition: A | Discharge status: Home / Self Care | Payer: 59 | Attending: Emergency Medicine | Admitting: Emergency Medicine

## 2016-03-14 DIAGNOSIS — I10 Essential (primary) hypertension: Secondary | ICD-10-CM | POA: Insufficient documentation

## 2016-03-14 DIAGNOSIS — Y9389 Activity, other specified: Secondary | ICD-10-CM | POA: Insufficient documentation

## 2016-03-14 DIAGNOSIS — Z87891 Personal history of nicotine dependence: Secondary | ICD-10-CM | POA: Insufficient documentation

## 2016-03-14 DIAGNOSIS — Z79899 Other long term (current) drug therapy: Secondary | ICD-10-CM | POA: Insufficient documentation

## 2016-03-14 DIAGNOSIS — Y9289 Other specified places as the place of occurrence of the external cause: Secondary | ICD-10-CM | POA: Insufficient documentation

## 2016-03-14 DIAGNOSIS — S93402A Sprain of unspecified ligament of left ankle, initial encounter: Secondary | ICD-10-CM

## 2016-03-14 DIAGNOSIS — X501XXA Overexertion from prolonged static or awkward postures, initial encounter: Secondary | ICD-10-CM | POA: Insufficient documentation

## 2016-03-14 DIAGNOSIS — S8262XA Displaced fracture of lateral malleolus of left fibula, initial encounter for closed fracture: Secondary | ICD-10-CM | POA: Insufficient documentation

## 2016-03-14 DIAGNOSIS — Y999 Unspecified external cause status: Secondary | ICD-10-CM | POA: Insufficient documentation

## 2016-03-14 MED ORDER — OXYCODONE-ACETAMINOPHEN 5-325 MG PO TABS: 2.0000 | ORAL_TABLET | ORAL | 0 refills | Status: AC | PRN

## 2016-03-14 NOTE — ED Notes (Signed)
Ortho Tech paged.

## 2016-03-14 NOTE — ED Provider Notes (Signed)
[facility]-EMERGENCY DEPT Provider Note   CSN: [id number] Arrival date & time: 03/14/16  1515  By signing my name below, I, Nelwyn Salisbury, attest that this documentation has been prepared under the direction and in the presence of non-physician practitioner, Rise Mu, PA-C.Marland Kitchen Electronically Signed: Nelwyn Salisbury, Scribe. 03/14/2016. 6:01 PM.  History   Chief Complaint Chief Complaint  Patient presents with  . Ankle Pain   The history is provided by the patient. No language interpreter was used.    HPI Comments:  Alexandra [name] is a 40 y.o. female who presents to the Emergency Department complaining of constant moderate unchanged left ankle pain beginning last night. Pt reports that she was letting her dogs out when they dogs went between her legs and she landed on her left foot and twisted her ankle. She reports associated swelling to the area. Pt has tried icing and heating her ankle with some relief of symptoms. Pt denies any focal numbness, wound or weakness.   Past Medical History:  Diagnosis Date  . Anemia   . Depression   . Frequent headaches   . Gestational diabetes   . Hypertension   . PCOS (polycystic ovarian syndrome)   . Preterm labor   . Sciatic pain   . Vaginal Pap smear, abnormal     Patient Active Problem List   Diagnosis Date Noted  . Encounter for BCP (birth control pills) initial prescription 02/16/2015  . Essential hypertension 01/06/2015  . Gestational diabetes 01/06/2015  . Post partum depression 01/06/2015  . GERD (gastroesophageal reflux disease) 01/06/2015  . Preeclampsia in postpartum period 11/01/2014  . Postpartum hypertension 10/29/2014  . Pre-eclampsia 10/28/2014  . Asymptomatic hypertensive urgency 10/27/2014  . History of preterm premature rupture of membranes (PROM) in previous pregnancy, currently pregnant   . Pelvic pressure in pregnancy, antepartum   . [redacted] weeks gestation of pregnancy   . Migraine 04/27/2014  . [redacted] weeks  gestation of pregnancy   . 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 Pregnancy 04/04/2014  . High-risk pregnancy 01/22/2014    Past Surgical History:  Procedure Laterality Date  . COLPOSCOPY    . DILATION AND CURETTAGE OF UTERUS      OB History    Gravida Para Term Preterm AB Living   3 1 1   2 1    SAB TAB Ectopic Multiple Live Births   2     0 1       Home Medications    Prior to Admission medications   Medication Sig Start Date End Date Taking? Authorizing Provider  buPROPion (WELLBUTRIN XL) 150 MG 24 hr tablet Take 1 tablet (150 mg total) by mouth daily. 03/02/15   Veryl Speak, FNP  labetalol (NORMODYNE) 200 MG tablet Take 1 tablet (200 mg total) by mouth 3 (three) times daily. 10/29/14   Carrington Clamp, MD  NIFEdipine (PROCARDIA-XL/ADALAT CC) 60 MG 24 hr tablet Take 1 tablet (60 mg total) by mouth daily at 12 noon. 02/22/15   Veryl Speak, FNP  omeprazole (PRILOSEC) 40 MG capsule Take 1 capsule (40 mg total) by mouth daily. 01/12/15   Veryl Speak, FNP  Prenatal Vit-Fe Fumarate-FA (PRENATAL MULTIVITAMIN) TABS tablet Take 1 tablet by mouth daily.    Historical Provider, MD    Family History Family History  Problem Relation Age of Onset  . Hypertension Mother   . Heart disease Mother   . Hyperlipidemia Mother   . Migraines Mother   .  Heart disease Father   . Stomach cancer Father        . Hypertension Father   . Cancer Sister   . Diabetes Paternal Aunt   . Thyroid disease Other   . Migraines Brother   . Birth defects Brother     brain tumor    Social History Social History  Substance Use Topics  . Smoking status: Former Smoker    Packs/day: 0.25    Years: 1.50    Types: Cigarettes    Quit date: 01/17/2014  . Smokeless tobacco: Former NeurosurgeonUser    Quit date: 01/17/2014  . Alcohol use No     Comment: Occasionally - 2 per year     Allergies   Lortab [hydrocodone-acetaminophen]   Review of Systems Review of  Systems  Musculoskeletal: Positive for arthralgias and joint swelling.  Skin: Negative for wound.  Neurological: Negative for weakness and numbness.  All other systems reviewed and are negative.    Physical Exam Updated Vital Signs BP (!) 153/102 (BP Location: Right Arm)   Pulse 75   Temp 97.8 F (36.6 C) (Oral)   Resp 18   Ht 6' (1.829 m)   Wt 180 lb (81.6 kg)   LMP 02/19/2016 (Within Days)   SpO2 100%   BMI 24.41 kg/m   Physical Exam  Constitutional: She is oriented to person, place, and time. She appears well-developed and well-nourished. No distress.  HENT:  Head: Normocephalic and atraumatic.  Eyes: Conjunctivae are normal.  Cardiovascular: Normal rate.   Pulmonary/Chest: Effort normal.  Abdominal: She exhibits no distension.  Musculoskeletal: Normal range of motion.       Right ankle: She exhibits swelling and ecchymosis. She exhibits normal range of motion, no deformity and normal pulse. Tenderness. Lateral malleolus tenderness found. No medial malleolus and no head of 5th metatarsal tenderness found.  DP pulses are 2+ bilaterally. Sensation intact. Cap refill normal.6 4 range of motion of left knee. No tenderness, deformity, ecchymosis, edema noted to the left knee.  Neurological: She is alert and oriented to person, place, and time.  Skin: Skin is warm and dry. Capillary refill takes less than 2 seconds.  Psychiatric: She has a normal mood and affect.  Nursing note and vitals reviewed.    ED Treatments / Results  DIAGNOSTIC STUDIES:  Oxygen Saturation is 100% on RA, normal by my interpretation.    COORDINATION OF CARE:  6:00 PM Discussed treatment plan with pt at bedside which includes camwalker and crutches and pt agreed to plan.  Labs (all labs ordered are listed, but only abnormal results are displayed) Labs Reviewed - No data to display  EKG  EKG Interpretation None       Radiology Dg Ankle Complete Left  Result Date: 03/14/2016 CLINICAL  DATA:  Larey SeatFell last night, tripped over dog. Twisted LEFT ankle. EXAM: LEFT ANKLE COMPLETE - 3+ VIEW COMPARISON:  None. FINDINGS: Crescentic small bony fragments project inferomedial to the lateral malleolus with slight cortical irregularity of the lateral talus. The ankle mortise appears congruent and the tibiofibular syndesmosis intact. No destructive bony lesions. Lateral ankle soft tissue swelling without subcutaneous gas or radiopaque foreign bodies. IMPRESSION: Lateral talus versus lateral malleolus small avulsion fracture. No dislocation. Electronically Signed   By: Awilda Metroourtnay  Bloomer M.D.   On: 03/14/2016 16:07    Procedures Procedures (including critical care time)  Medications Ordered in ED Medications - No data to display   Initial Impression / Assessment and Plan / ED Course  I  have reviewed the triage vital signs and the nursing notes.  Pertinent labs & imaging results that were available during my care of the patient were reviewed by me and considered in my medical decision making (see chart for details).  Clinical Course   Patient presents to the ED with left ankle pain after sustaining a ankle sprain. Patient is neurovascularly intact. She does have range of motion with full plantar and dorsiflexion. X-ray reveals a lateral talus versus lateral malleolus small avulsion fracture. There is no dislocation. I placed patient in a cam walker with crutches. I have given her referral to orthopedics. Encouraged rice therapy and minimal weightbearing. Pt is hemodynamically stable, in NAD, & able to ambulate in the ED. Pain has been managed & has no complaints prior to dc. Pt is comfortable with above plan and is stable for discharge at this time. All questions were answered prior to disposition. Strict return precautions for f/u to the ED were discussed.   Final Clinical Impressions(s) / ED Diagnoses   Final diagnoses:  Closed avulsion fracture of lateral malleolus of left fibula, initial  encounter  Sprain of left ankle, unspecified ligament, initial encounter    New Prescriptions Discharge Medication List as of 03/14/2016  6:08 PM    START taking these medications   Details  oxyCODONE-acetaminophen (PERCOCET/ROXICET) 5-325 MG tablet Take 2 tablets by mouth every 4 (four) hours as needed for severe pain., Starting Wed 03/14/2016, Print      I personally performed the services described in this documentation, which was scribed in my presence. The recorded information has been reviewed and is accurate.     Rise MuKenneth T Tityana Pagan, PA-C 03/15/16 [id number]    Gwyneth SproutWhitney Plunkett, MD 03/15/16 [id number]

## 2016-03-14 NOTE — ED Triage Notes (Addendum)
Pt presents for evaluation of L ankle pain. Pt states she tripped going down stairs in dark. Pt. Denies LOC or other injury. Positive PMS in L foot. Pt ambulatory on arrival.

## 2016-03-14 NOTE — ED Notes (Signed)
The pt fell off the porch last pm twisting his lt ankle  Swollen laterally

## 2016-03-14 NOTE — Discharge Instructions (Signed)
Please wear the cam walker when walking. Use the crutches for minimal weightbearing. When you are home you may take the cam walker off and rest, ice, elevate your left ankle. You may take the pain medicine as needed. May also take Motrin or ibuprofen for swelling and pain. Please call for an appointment with orthopedist tomorrow morning. Return to the ED if your symptoms worsen or if you developed worsening pain, worsening swelling, unable to fill your toes or for any other reason.

## 2016-05-15 ENCOUNTER — Emergency Department (HOSPITAL_COMMUNITY): Payer: Self-pay

## 2016-05-15 ENCOUNTER — Emergency Department (HOSPITAL_COMMUNITY)
Admission: EM | Admit: 2016-05-15 | Discharge: 2016-05-15 | Disposition: A | Discharge status: Home / Self Care | Payer: Self-pay | Attending: Emergency Medicine | Admitting: Emergency Medicine

## 2016-05-15 ENCOUNTER — Encounter (HOSPITAL_COMMUNITY): Payer: Self-pay | Admitting: Emergency Medicine

## 2016-05-15 DIAGNOSIS — I10 Essential (primary) hypertension: Secondary | ICD-10-CM | POA: Insufficient documentation

## 2016-05-15 DIAGNOSIS — G43809 Other migraine, not intractable, without status migrainosus: Secondary | ICD-10-CM | POA: Insufficient documentation

## 2016-05-15 DIAGNOSIS — Z87891 Personal history of nicotine dependence: Secondary | ICD-10-CM | POA: Insufficient documentation

## 2016-05-15 DIAGNOSIS — Z79899 Other long term (current) drug therapy: Secondary | ICD-10-CM | POA: Insufficient documentation

## 2016-05-15 DIAGNOSIS — Z5181 Encounter for therapeutic drug level monitoring: Secondary | ICD-10-CM | POA: Insufficient documentation

## 2016-05-15 LAB — DIFFERENTIAL
BASOS PCT: 1 %
Basophils Absolute: 0.1 10*3/uL (ref 0.0–0.1)
EOS ABS: 0.2 10*3/uL (ref 0.0–0.7)
EOS PCT: 2 %
LYMPHS ABS: 2.4 10*3/uL (ref 0.7–4.0)
Lymphocytes Relative: 32 %
MONOS PCT: 6 %
Monocytes Absolute: 0.5 10*3/uL (ref 0.1–1.0)
NEUTROS PCT: 59 %
Neutro Abs: 4.6 10*3/uL (ref 1.7–7.7)

## 2016-05-15 LAB — CBC
HCT: 40.1 % (ref 36.0–46.0)
Hemoglobin: 13.3 g/dL (ref 12.0–15.0)
MCH: 27.9 pg (ref 26.0–34.0)
MCHC: 33.2 g/dL (ref 30.0–36.0)
MCV: 84.2 fL (ref 78.0–100.0)
Platelets: 275 K/uL (ref 150–400)
RBC: 4.76 MIL/uL (ref 3.87–5.11)
RDW: 13.7 % (ref 11.5–15.5)
WBC: 7.7 K/uL (ref 4.0–10.5)

## 2016-05-15 LAB — I-STAT CHEM 8, ED
BUN: 4 mg/dL — ABNORMAL LOW (ref 6–20)
Calcium, Ion: 1.14 mmol/L — ABNORMAL LOW (ref 1.15–1.40)
Chloride: 103 mmol/L (ref 101–111)
Creatinine, Ser: 0.7 mg/dL (ref 0.44–1.00)
Glucose, Bld: 97 mg/dL (ref 65–99)
HEMATOCRIT: 42 % (ref 36.0–46.0)
HEMOGLOBIN: 14.3 g/dL (ref 12.0–15.0)
Potassium: 3.4 mmol/L — ABNORMAL LOW (ref 3.5–5.1)
SODIUM: 139 mmol/L (ref 135–145)
TCO2: 28 mmol/L (ref 0–100)

## 2016-05-15 LAB — URINALYSIS, ROUTINE W REFLEX MICROSCOPIC
Bacteria, UA: NONE SEEN
Bilirubin Urine: NEGATIVE
Glucose, UA: NEGATIVE mg/dL
Ketones, ur: 5 mg/dL — AB
Leukocytes, UA: NEGATIVE
Nitrite: NEGATIVE
Protein, ur: 30 mg/dL — AB
Specific Gravity, Urine: 1.016 (ref 1.005–1.030)
pH: 6 (ref 5.0–8.0)

## 2016-05-15 LAB — COMPREHENSIVE METABOLIC PANEL WITH GFR
ALT: 12 U/L — ABNORMAL LOW (ref 14–54)
AST: 15 U/L (ref 15–41)
Albumin: 4 g/dL (ref 3.5–5.0)
Alkaline Phosphatase: 67 U/L (ref 38–126)
Anion gap: 9 (ref 5–15)
BUN: 5 mg/dL — ABNORMAL LOW (ref 6–20)
CO2: 25 mmol/L (ref 22–32)
Calcium: 9.3 mg/dL (ref 8.9–10.3)
Chloride: 104 mmol/L (ref 101–111)
Creatinine, Ser: 0.71 mg/dL (ref 0.44–1.00)
GFR calc Af Amer: >60 mL/min
GFR calc non Af Amer: >60 mL/min
Glucose, Bld: 96 mg/dL (ref 65–99)
Potassium: 3.4 mmol/L — ABNORMAL LOW (ref 3.5–5.1)
Sodium: 138 mmol/L (ref 135–145)
Total Bilirubin: 0.7 mg/dL (ref 0.3–1.2)
Total Protein: 7.5 g/dL (ref 6.5–8.1)

## 2016-05-15 LAB — I-STAT TROPONIN, ED: Troponin i, poc: 0 ng/mL (ref 0.00–0.08)

## 2016-05-15 LAB — APTT: aPTT: 37 s — ABNORMAL HIGH (ref 24–36)

## 2016-05-15 LAB — PROTIME-INR
INR: 1
Prothrombin Time: 13.2 s (ref 11.4–15.2)

## 2016-05-15 MED ORDER — DIPHENHYDRAMINE HCL 25 MG PO TABS: 25.0000 mg | ORAL_TABLET | Freq: Three times a day (TID) | ORAL | 0 refills | Status: AC | PRN

## 2016-05-15 MED ORDER — SODIUM CHLORIDE 0.9 % IV BOLUS (SEPSIS)
1000.0000 mL | Freq: Once | INTRAVENOUS | Status: AC
  Administered 2016-05-15: 1000 mL via INTRAVENOUS

## 2016-05-15 MED ORDER — METOCLOPRAMIDE HCL 5 MG/ML IJ SOLN
10.0000 mg | Freq: Once | INTRAMUSCULAR | Status: AC
  Administered 2016-05-15: 10 mg via INTRAVENOUS
  Filled 2016-05-15: qty 2

## 2016-05-15 MED ORDER — DEXAMETHASONE SODIUM PHOSPHATE 10 MG/ML IJ SOLN
10.0000 mg | Freq: Once | INTRAMUSCULAR | Status: AC
  Administered 2016-05-15: 10 mg via INTRAVENOUS
  Filled 2016-05-15: qty 1

## 2016-05-15 MED ORDER — NAPROXEN 375 MG PO TABS: 375.0000 mg | ORAL_TABLET | Freq: Two times a day (BID) | ORAL | 0 refills | Status: AC | PRN

## 2016-05-15 MED ORDER — DIPHENHYDRAMINE HCL 50 MG/ML IJ SOLN
50.0000 mg | Freq: Once | INTRAMUSCULAR | Status: AC
  Administered 2016-05-15: 50 mg via INTRAVENOUS
  Filled 2016-05-15: qty 1

## 2016-05-15 MED ORDER — AMOXICILLIN-POT CLAVULANATE 875-125 MG PO TABS: 1.0000 | ORAL_TABLET | Freq: Two times a day (BID) | ORAL | 0 refills | Status: AC

## 2016-05-15 MED ORDER — METOCLOPRAMIDE HCL 10 MG PO TABS: 10.0000 mg | ORAL_TABLET | Freq: Three times a day (TID) | ORAL | 0 refills | Status: AC | PRN

## 2016-05-15 MED ORDER — KETOROLAC TROMETHAMINE 15 MG/ML IJ SOLN
15.0000 mg | Freq: Once | INTRAMUSCULAR | Status: AC
  Administered 2016-05-15: 15 mg via INTRAVENOUS
  Filled 2016-05-15: qty 1

## 2016-05-15 NOTE — ED Triage Notes (Signed)
Patient presents with migraine since last night - is very tearful in triage, stating that this is the worst headache she's ever had, accompanied by L arm weakness and high blood pressure. States she had pre-eclampsia and high blood pressure when she was pregnant years ago but hadn't had an issue like this since. Also c/o hot flashes and N/V with this migraine. Neuro intact at this time, ambulatory in room.

## 2016-05-15 NOTE — ED Provider Notes (Signed)
[facility]PT Provider Note   CSN: [id number] Arrival date & time: 05/15/16  1512     History   Chief Complaint Chief Complaint  Patient presents with  . Migraine  . Hypertension    HPI Alexandra [name] is a 41 y.o. female.  HPI   41 year old female with past medical history as below including frequent headaches and migraines who presents with headache. The patient states that over the last week, she has been under a significant amount of increased stress. She has been having increasing frequent headaches over the last week. Her current headache began gradually last night. It began as a left-sided throbbing, aching pain that then radiated towards the top of her head. This is not atypical of her headaches but is more severe than usual and has not resolved with her Goody's powders and Tylenol, which she normally uses. She also endorses intermittent tingling in her left arm, which is also not uncommon for her during her migraines. She notes an associated dull, throbbing toothache and has been set up with a dentist for this. Denies any facial swelling. No fever or chills. No neck stiffness.  Past Medical History:  Diagnosis Date  . Anemia   . Depression   . Frequent headaches   . Gestational diabetes   . Hypertension   . PCOS (polycystic ovarian syndrome)   . Preterm labor   . Sciatic pain   . Vaginal Pap smear, abnormal     Patient Active Problem List   Diagnosis Date Noted  . Encounter for BCP (birth control pills) initial prescription 02/16/2015  . Essential hypertension 01/06/2015  . Gestational diabetes 01/06/2015  . Post partum depression 01/06/2015  . GERD (gastroesophageal reflux disease) 01/06/2015  . Preeclampsia in postpartum period 11/01/2014  . Postpartum hypertension 10/29/2014  . Pre-eclampsia 10/28/2014  . Asymptomatic hypertensive urgency 10/27/2014  . History of preterm premature rupture of membranes (PROM) in previous pregnancy, currently pregnant     . Pelvic pressure in pregnancy, antepartum   . [redacted] weeks gestation of pregnancy   . Migraine 04/27/2014  . [redacted] weeks gestation of pregnancy   . 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 Pregnancy 04/04/2014  . High-risk pregnancy 01/22/2014    Past Surgical History:  Procedure Laterality Date  . COLPOSCOPY    . DILATION AND CURETTAGE OF UTERUS      OB History    Gravida Para Term Preterm AB Living   3 1 1   2 1    SAB TAB Ectopic Multiple Live Births   2     0 1       Home Medications    Prior to Admission medications   Medication Sig Start Date End Date Taking? Authorizing Provider  amoxicillin-clavulanate (AUGMENTIN) 875-125 MG tablet Take 1 tablet by mouth every 12 (twelve) hours. 05/15/16 05/25/16  Shaune Pollack, MD  buPROPion (WELLBUTRIN XL) 150 MG 24 hr tablet Take 1 tablet (150 mg total) by mouth daily. 03/02/15   Veryl Speak, FNP  diphenhydrAMINE (BENADRYL) 25 MG tablet Take 1 tablet (25 mg total) by mouth every 8 (eight) hours as needed (with reglan). 05/15/16 06/14/16  Shaune Pollack, MD  labetalol (NORMODYNE) 200 MG tablet Take 1 tablet (200 mg total) by mouth 3 (three) times daily. 10/29/14   Carrington Clamp, MD  metoCLOPramide (REGLAN) 10 MG tablet Take 1 tablet (10 mg total) by mouth every 8 (eight) hours as needed for nausea or vomiting (headache). 05/15/16   Sheria Lang  Erma Heritage, MD  naproxen (NAPROSYN) 375 MG tablet Take 1 tablet (375 mg total) by mouth 2 (two) times daily as needed for moderate pain. 05/15/16 05/22/16  Shaune Pollack, MD  NIFEdipine (PROCARDIA-XL/ADALAT CC) 60 MG 24 hr tablet Take 1 tablet (60 mg total) by mouth daily at 12 noon. 02/22/15   Veryl Speak, FNP  omeprazole (PRILOSEC) 40 MG capsule Take 1 capsule (40 mg total) by mouth daily. 01/12/15   Veryl Speak, FNP  oxyCODONE-acetaminophen (PERCOCET/ROXICET) 5-325 MG tablet Take 2 tablets by mouth every 4 (four) hours as needed for severe pain. 03/14/16   Rise Mu, PA-C  Prenatal Vit-Fe Fumarate-FA (PRENATAL MULTIVITAMIN) TABS tablet Take 1 tablet by mouth daily.    Historical Provider, MD    Family History Family History  Problem Relation Age of Onset  . Hypertension Mother   . Heart disease Mother   . Hyperlipidemia Mother   . Migraines Mother   . Heart disease Father   . Stomach cancer Father        . Hypertension Father   . Cancer Sister   . Diabetes Paternal Aunt   . Thyroid disease Other   . Migraines Brother   . Birth defects Brother     brain tumor    Social History Social History  Substance Use Topics  . Smoking status: Former Smoker    Packs/day: 0.25    Years: 1.50    Types: Cigarettes    Quit date: 01/17/2014  . Smokeless tobacco: Former Neurosurgeon    Quit date: 01/17/2014  . Alcohol use No     Comment: Occasionally - 2 per year     Allergies   Lortab [hydrocodone-acetaminophen]   Review of Systems Review of Systems  Constitutional: Positive for fatigue. Negative for fever.  HENT: Positive for dental problem.   Neurological: Positive for numbness and headaches.  All other systems reviewed and are negative.    Physical Exam Updated Vital Signs BP 147/94   Pulse 73   Temp 98.1 F (36.7 C) (Oral)   Resp 14   LMP 05/15/2016 (Exact Date)   SpO2 98%   Physical Exam  Constitutional: She is oriented to person, place, and time. She appears well-developed and well-nourished. No distress.  HENT:  Head: Normocephalic and atraumatic.  Markedly poor dentition. Left second upper premolar cracked with obvious decay. There is mild surrounding gingival swelling but no obvious gingival or periapical abscess.  Eyes: Conjunctivae are normal.  Neck: Neck supple.  Cardiovascular: Normal rate, regular rhythm and normal heart sounds.  Exam reveals no friction rub.   No murmur heard. Pulmonary/Chest: Effort normal and breath sounds normal. No respiratory distress. She has no wheezes. She has no rales.  Abdominal: She  exhibits no distension.  Musculoskeletal: She exhibits no edema.  Neurological: She is alert and oriented to person, place, and time. She exhibits normal muscle tone.  Skin: Skin is warm. Capillary refill takes less than 2 seconds.  Psychiatric: She has a normal mood and affect.  Nursing note and vitals reviewed.   Neurological Exam:  Mental Status: Alert and oriented to person, place, and time. Attention and concentration normal. Speech clear. Recent memory is intact. Cranial Nerves: Visual fields grossly intact. EOMI and PERRLA. No nystagmus noted. Facial sensation intact at forehead, maxillary cheek, and chin/mandible bilaterally. No facial asymmetry or weakness. Hearing grossly normal. Uvula is midline, and palate elevates symmetrically. Normal SCM and trapezius strength. Tongue midline without fasciculations. Motor: Muscle strength 5/5 in  proximal and distal UE and LE bilaterally. No pronator drift. Muscle tone normal. Reflexes: 2+ and symmetrical in all four extremities.  Sensation: Intact to light touch in upper and lower extremities distally bilaterally.  Gait: Normal without ataxia. Coordination: Normal FTN bilaterally.     ED Treatments / Results  Labs (all labs ordered are listed, but only abnormal results are displayed) Labs Reviewed  APTT - Abnormal; Notable for the following:       Result Value   aPTT 37 (*)    All other components within normal limits  COMPREHENSIVE METABOLIC PANEL - Abnormal; Notable for the following:    Potassium 3.4 (*)    BUN 5 (*)    ALT 12 (*)    All other components within normal limits  URINALYSIS, ROUTINE W REFLEX MICROSCOPIC - Abnormal; Notable for the following:    APPearance HAZY (*)    Hgb urine dipstick LARGE (*)    Ketones, ur 5 (*)    Protein, ur 30 (*)    Squamous Epithelial / LPF 6-30 (*)    All other components within normal limits  I-STAT CHEM 8, ED - Abnormal; Notable for the following:    Potassium 3.4 (*)    BUN 4 (*)      Calcium, Ion 1.14 (*)    All other components within normal limits  PROTIME-INR  CBC  DIFFERENTIAL  PREGNANCY, URINE  I-STAT TROPOININ, ED    EKG  EKG Interpretation None       Radiology Ct Head Wo Contrast  Result Date: 05/15/2016 CLINICAL DATA:  Severe headache.  LEFT arm weakness EXAM: CT HEAD WITHOUT CONTRAST TECHNIQUE: Contiguous axial images were obtained from the base of the skull through the vertex without intravenous contrast. COMPARISON:  None. FINDINGS: Brain: No acute intracranial hemorrhage. No focal mass lesion. No CT evidence of acute infarction. No midline shift or mass effect. No hydrocephalus. Basilar cisterns are patent. Vascular: No hyperdense vessel or unexpected calcification. Skull: Normal. Negative for fracture or focal lesion. Sinuses/Orbits: Paranasal sinuses and mastoid air cells are clear. Orbits are clear. Other: None. IMPRESSION: No acute intracranial findings.  Normal head CT. Electronically Signed   By: Genevive BiStewart  Edmunds M.D.   On: 05/15/2016 16:32    Procedures Procedures (including critical care time)  Medications Ordered in ED Medications  metoCLOPramide (REGLAN) injection 10 mg (10 mg Intravenous Given 05/15/16 1910)  diphenhydrAMINE (BENADRYL) injection 50 mg (50 mg Intravenous Given 05/15/16 1910)  sodium chloride 0.9 % bolus 1,000 mL (0 mLs Intravenous Stopped 05/15/16 2010)  ketorolac (TORADOL) 15 MG/ML injection 15 mg (15 mg Intravenous Given 05/15/16 1910)  dexamethasone (DECADRON) injection 10 mg (10 mg Intravenous Given 05/15/16 1910)     Initial Impression / Assessment and Plan / ED Course  I have reviewed the triage vital signs and the nursing notes.  Pertinent labs & imaging results that were available during my care of the patient were reviewed by me and considered in my medical decision making (see chart for details).    41 yo F with PMHx as above including chronic migraines who p/w acute on chronic migraine. No fever or chills,  VSS and WNL. Exam is as above. Regarding her HA, I suspect this is 2/2 migraine HA. HA is similar to her chronic migraines but more severe than usual, though in similar distribution. She has known stressors at home. Will give migraine meds and re-assess. No fever, neck stiffness, pt well appearing, no other infectious sx - doubt meningitis  or encephalitis. CT Head obtained in triage is unremarkable (entered as RN protocol) - do not suspect CVA, intracranial mass/lesion, SAH/ICH. No focal neuro deficits. She may also have a component of pain 2/2 sinusitis/tooth ache - will tx. No evidence of Ludwig's, facial abscess, or other complication.  HA completely resolved with migraine meds. Will d/c with supportive care. Labs o/w reassuring.   Final Clinical Impressions(s) / ED Diagnoses   Final diagnoses:  Other migraine without status migrainosus, not intractable    New Prescriptions Discharge Medication List as of 05/15/2016  9:00 PM    START taking these medications   Details  amoxicillin-clavulanate (AUGMENTIN) 875-125 MG tablet Take 1 tablet by mouth every 12 (twelve) hours., Starting Tue 05/15/2016, Until Fri 05/25/2016, Print    diphenhydrAMINE (BENADRYL) 25 MG tablet Take 1 tablet (25 mg total) by mouth every 8 (eight) hours as needed (with reglan)., Starting Tue 05/15/2016, Until Thu 06/14/2016, Print    metoCLOPramide (REGLAN) 10 MG tablet Take 1 tablet (10 mg total) by mouth every 8 (eight) hours as needed for nausea or vomiting (headache)., Starting Tue 05/15/2016, Print    naproxen (NAPROSYN) 375 MG tablet Take 1 tablet (375 mg total) by mouth 2 (two) times daily as needed for moderate pain., Starting Tue 05/15/2016, Until Tue 05/22/2016, Print         Shaune Pollack, MD 05/15/16 [phone]

## 2016-09-27 ENCOUNTER — Encounter (HOSPITAL_COMMUNITY): Payer: Self-pay | Admitting: Emergency Medicine

## 2016-09-27 ENCOUNTER — Emergency Department (HOSPITAL_COMMUNITY)
Admission: EM | Admit: 2016-09-27 | Discharge: 2016-09-27 | Disposition: A | Discharge status: Home / Self Care | Payer: 59 | Attending: Emergency Medicine | Admitting: Emergency Medicine

## 2016-09-27 DIAGNOSIS — Z79899 Other long term (current) drug therapy: Secondary | ICD-10-CM | POA: Insufficient documentation

## 2016-09-27 DIAGNOSIS — D649 Anemia, unspecified: Secondary | ICD-10-CM | POA: Insufficient documentation

## 2016-09-27 DIAGNOSIS — I10 Essential (primary) hypertension: Secondary | ICD-10-CM | POA: Insufficient documentation

## 2016-09-27 DIAGNOSIS — R519 Headache, unspecified: Secondary | ICD-10-CM

## 2016-09-27 DIAGNOSIS — R51 Headache: Secondary | ICD-10-CM | POA: Insufficient documentation

## 2016-09-27 DIAGNOSIS — Z87891 Personal history of nicotine dependence: Secondary | ICD-10-CM | POA: Insufficient documentation

## 2016-09-27 DIAGNOSIS — R112 Nausea with vomiting, unspecified: Secondary | ICD-10-CM

## 2016-09-27 LAB — URINALYSIS, ROUTINE W REFLEX MICROSCOPIC
BACTERIA UA: NONE SEEN
Bilirubin Urine: NEGATIVE
Glucose, UA: NEGATIVE mg/dL
Ketones, ur: 20 mg/dL — AB
Leukocytes, UA: NEGATIVE
NITRITE: NEGATIVE
Protein, ur: 30 mg/dL — AB
SPECIFIC GRAVITY, URINE: 1.021 (ref 1.005–1.030)
pH: 6 (ref 5.0–8.0)

## 2016-09-27 LAB — COMPREHENSIVE METABOLIC PANEL
ALBUMIN: 4 g/dL (ref 3.5–5.0)
ALK PHOS: 66 U/L (ref 38–126)
ALT: 14 U/L (ref 14–54)
AST: 18 U/L (ref 15–41)
Anion gap: 9 (ref 5–15)
BILIRUBIN TOTAL: 0.9 mg/dL (ref 0.3–1.2)
BUN: 7 mg/dL (ref 6–20)
CO2: 25 mmol/L (ref 22–32)
CREATININE: 0.8 mg/dL (ref 0.44–1.00)
Calcium: 9 mg/dL (ref 8.9–10.3)
Chloride: 100 mmol/L — ABNORMAL LOW (ref 101–111)
GFR calc Af Amer: >60 mL/min (ref >60)
GFR calc non Af Amer: >60 mL/min (ref >60)
GLUCOSE: 95 mg/dL (ref 65–99)
POTASSIUM: 3.6 mmol/L (ref 3.5–5.1)
Sodium: 134 mmol/L — ABNORMAL LOW (ref 135–145)
TOTAL PROTEIN: 7.4 g/dL (ref 6.5–8.1)

## 2016-09-27 LAB — CBC
HEMATOCRIT: 39.3 % (ref 36.0–46.0)
Hemoglobin: 12.9 g/dL (ref 12.0–15.0)
MCH: 28 pg (ref 26.0–34.0)
MCHC: 32.8 g/dL (ref 30.0–36.0)
MCV: 85.2 fL (ref 78.0–100.0)
Platelets: 245 10*3/uL (ref 150–400)
RBC: 4.61 MIL/uL (ref 3.87–5.11)
RDW: 14.1 % (ref 11.5–15.5)
WBC: 6 10*3/uL (ref 4.0–10.5)

## 2016-09-27 LAB — POC URINE PREG, ED: PREG TEST UR: NEGATIVE

## 2016-09-27 MED ORDER — DIPHENHYDRAMINE HCL 50 MG/ML IJ SOLN
25.0000 mg | Freq: Once | INTRAMUSCULAR | Status: AC
  Administered 2016-09-27: 25 mg via INTRAVENOUS
  Filled 2016-09-27: qty 1

## 2016-09-27 MED ORDER — NIFEDIPINE ER 60 MG PO TB24
60.0000 mg | ORAL_TABLET | Freq: Once | ORAL | Status: AC
  Administered 2016-09-27: 60 mg via ORAL
  Filled 2016-09-27: qty 1

## 2016-09-27 MED ORDER — KETOROLAC TROMETHAMINE 30 MG/ML IJ SOLN
30.0000 mg | Freq: Once | INTRAMUSCULAR | Status: AC
  Administered 2016-09-27: 30 mg via INTRAVENOUS
  Filled 2016-09-27: qty 1

## 2016-09-27 MED ORDER — PROCHLORPERAZINE EDISYLATE 5 MG/ML IJ SOLN
10.0000 mg | Freq: Once | INTRAMUSCULAR | Status: AC
  Administered 2016-09-27: 10 mg via INTRAVENOUS
  Filled 2016-09-27: qty 2

## 2016-09-27 MED ORDER — LABETALOL HCL 200 MG PO TABS
200.0000 mg | ORAL_TABLET | Freq: Once | ORAL | Status: AC
  Administered 2016-09-27: 200 mg via ORAL
  Filled 2016-09-27: qty 1

## 2016-09-27 MED ORDER — SODIUM CHLORIDE 0.9 % IV BOLUS (SEPSIS)
1000.0000 mL | Freq: Once | INTRAVENOUS | Status: AC
  Administered 2016-09-27: 1000 mL via INTRAVENOUS

## 2016-09-27 NOTE — ED Provider Notes (Signed)
[facility] Provider Note   CSN: [id number] Arrival date & time: 09/27/16  1257     History   Chief Complaint Chief Complaint  Patient presents with  . Emesis  . Generalized Body Aches    HPI Alexandra [name] is a 41 y.o. female.  41yo F w/ PMH including PCOS, migraines, HTN, who p/w headache, nausea and vomiting. Several days ago, She got a neck massager as a gift and started using it. The next day she started having neck soreness. Later that day she began feeling cold and having nausea and vomiting. She feels hot from neck up; no measured fevers. She had a gradual onset of headache today that began around noon and is still present, currently rightsided and 8/10 in intensity. She does have hx of migraines. No sudden onset of severe HA or vision changes. She denies any cough/cold, sore throat, fevers, diarrhea, rash, tick bites, SOB, chest pain, abd pain, recent travel, urinary sx or vaginal discharge. Son had 1 episode of vomiting recently but no ongoing vomiting.       Past Medical History:  Diagnosis Date  . Anemia   . Depression   . Frequent headaches   . Gestational diabetes   . Hypertension   . PCOS (polycystic ovarian syndrome)   . Preterm labor   . Sciatic pain   . Vaginal Pap smear, abnormal     Patient Active Problem List   Diagnosis Date Noted  . Encounter for BCP (birth control pills) initial prescription 02/16/2015  . Essential hypertension 01/06/2015  . Gestational diabetes 01/06/2015  . Post partum depression 01/06/2015  . GERD (gastroesophageal reflux disease) 01/06/2015  . Preeclampsia in postpartum period 11/01/2014  . Postpartum hypertension 10/29/2014  . Pre-eclampsia 10/28/2014  . Asymptomatic hypertensive urgency 10/27/2014  . History of preterm premature rupture of membranes (PROM) in previous pregnancy, currently pregnant   . Pelvic pressure in pregnancy, antepartum   . [redacted] weeks gestation of pregnancy   . Migraine 04/27/2014  . [redacted]  weeks gestation of pregnancy   . 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 Pregnancy 04/04/2014  . High-risk pregnancy 01/22/2014    Past Surgical History:  Procedure Laterality Date  . COLPOSCOPY    . DILATION AND CURETTAGE OF UTERUS      OB History    Gravida Para Term Preterm AB Living   3 1 1   2 1    SAB TAB Ectopic Multiple Live Births   2     0 1       Home Medications    Prior to Admission medications   Medication Sig Start Date End Date Taking? Authorizing Provider  buPROPion (WELLBUTRIN XL) 150 MG 24 hr tablet Take 1 tablet (150 mg total) by mouth daily. 03/02/15   Veryl Speakalone, Gregory D, FNP  diphenhydrAMINE (BENADRYL) 25 MG tablet Take 1 tablet (25 mg total) by mouth every 8 (eight) hours as needed (with reglan). 05/15/16 06/14/16  Shaune PollackIsaacs, Cameron, MD  labetalol (NORMODYNE) 200 MG tablet Take 1 tablet (200 mg total) by mouth 3 (three) times daily. 10/29/14   Carrington ClampHorvath, Michelle, MD  metoCLOPramide (REGLAN) 10 MG tablet Take 1 tablet (10 mg total) by mouth every 8 (eight) hours as needed for nausea or vomiting (headache). 05/15/16   Shaune PollackIsaacs, Cameron, MD  NIFEdipine (PROCARDIA-XL/ADALAT CC) 60 MG 24 hr tablet Take 1 tablet (60 mg total) by mouth daily at 12 noon. 02/22/15   Veryl Speakalone, Gregory D, FNP  omeprazole (PRILOSEC)  40 MG capsule Take 1 capsule (40 mg total) by mouth daily. 01/12/15   Veryl Speak, FNP  oxyCODONE-acetaminophen (PERCOCET/ROXICET) 5-325 MG tablet Take 2 tablets by mouth every 4 (four) hours as needed for severe pain. 03/14/16   Rise Mu, PA-C  Prenatal Vit-Fe Fumarate-FA (PRENATAL MULTIVITAMIN) TABS tablet Take 1 tablet by mouth daily.    [provider]    Family History Family History  Problem Relation Age of Onset  . Hypertension Mother   . Heart disease Mother   . Hyperlipidemia Mother   . Migraines Mother   . Heart disease Father   . Stomach cancer Father           . Hypertension Father   .  Cancer Sister   . Diabetes Paternal Aunt   . Thyroid disease Other   . Migraines Brother   . Birth defects Brother        brain tumor    Social History Social History  Substance Use Topics  . Smoking status: Former Smoker    Packs/day: 0.25    Years: 1.50    Types: Cigarettes    Quit date: 01/17/2014  . Smokeless tobacco: Former Neurosurgeon    Quit date: 01/17/2014  . Alcohol use No     Comment: Occasionally - 2 per year     Allergies   Lortab [hydrocodone-acetaminophen]   Review of Systems Review of Systems All other systems reviewed and are negative except that which was mentioned in HPI   Physical Exam Updated Vital Signs BP 129/86   Pulse 88   Temp 98.1 F (36.7 C) (Oral)   Resp 16   SpO2 98%   Physical Exam  Constitutional: She is oriented to person, place, and time. She appears well-developed and well-nourished. No distress.  Uncomfortable, Awake, alert  HENT:  Head: Normocephalic and atraumatic.  Hirsutism   Eyes: Pupils are equal, round, and reactive to light. Conjunctivae and EOM are normal.  Neck: Neck supple.  Cardiovascular: Normal rate, regular rhythm and normal heart sounds.   No murmur heard. Pulmonary/Chest: Effort normal and breath sounds normal. No respiratory distress.  Abdominal: Soft. Bowel sounds are normal. She exhibits no distension. There is no tenderness.  Musculoskeletal: She exhibits no edema.  Neurological: She is alert and oriented to person, place, and time. She has normal reflexes. No cranial nerve deficit. She exhibits normal muscle tone.  Fluent speech, normal finger-to-nose testing, negative pronator drift, no clonus 5/5 strength and normal sensation x all 4 extremities  Skin: Skin is warm and dry.  Psychiatric: She has a normal mood and affect. Judgment and thought content normal.  Nursing note and vitals reviewed.    ED Treatments / Results  Labs (all labs ordered are listed, but only abnormal results are displayed) Labs  Reviewed  COMPREHENSIVE METABOLIC PANEL - Abnormal; Notable for the following:       Result Value   Sodium 134 (*)    Chloride 100 (*)    All other components within normal limits  URINALYSIS, ROUTINE W REFLEX MICROSCOPIC - Abnormal; Notable for the following:    APPearance HAZY (*)    Hgb urine dipstick SMALL (*)    Ketones, ur 20 (*)    Protein, ur 30 (*)    Squamous Epithelial / LPF 6-30 (*)    All other components within normal limits  CBC  POC URINE PREG, ED    EKG  EKG Interpretation None       Radiology  No results found.  Procedures Procedures (including critical care time)  Medications Ordered in ED Medications  sodium chloride 0.9 % bolus 1,000 mL (0 mLs Intravenous Stopped 09/27/16 2056)  diphenhydrAMINE (BENADRYL) injection 25 mg (25 mg Intravenous Given 09/27/16 1850)  prochlorperazine (COMPAZINE) injection 10 mg (10 mg Intravenous Given 09/27/16 1852)  labetalol (NORMODYNE) tablet 200 mg (200 mg Oral Given 09/27/16 1855)  NIFEdipine (PROCARDIA-XL/ADALAT CC) 24 hr tablet 60 mg (60 mg Oral Given 09/27/16 1855)  ketorolac (TORADOL) 30 MG/ML injection 30 mg (30 mg Intravenous Given 09/27/16 2056)     Initial Impression / Assessment and Plan / ED Course  I have reviewed the triage vital signs and the nursing notes.  Pertinent labs that were available during my care of the patient were reviewed by me and considered in my medical decision making (see chart for details).     PT w/ neck pain that began after using new neck massager, a few days of intermittent N/V, and today gradual onset of headache. She was non-toxic on exam, VS notable for HTN but afebrile. Normal neuro exam, no abdominal tenderness. Labs show normal LFTS and creatinine, no evidence of bacterial infection or dehydration. Given her hx of frequent headaches and no sudden/severe HA w/ reassuring exam, I do not feel she needs head imaging. She has had no neck stiffness, myalgias, or fevers to suggest  meningitis or tick-borne illness and no risk factors for tick exposure. Gave fluids, benadryl/compazine, toradol. Also gave dose of her BP medications as her HTN may be contributing to HA. Her BP improved during ED course. On re-exam, she was well appearing, tolerating PO and stating that her headache had almost resolved. She wanted to go home. She has reglan to use prn. I discussed supportive measures and extensively reviewed return precautions. She voiced understanding and was discharged in satisfactory condition.   Final Clinical Impressions(s) / ED Diagnoses   Final diagnoses:  Acute nonintractable headache, unspecified headache type  Non-intractable vomiting with nausea, unspecified vomiting type    New Prescriptions Discharge Medication List as of 09/27/2016  8:58 PM       Ava Deguire, Ambrose Finland, MD 09/28/16 1400

## 2016-09-27 NOTE — ED Notes (Signed)
Pt given sprite to drink.  Able to tolerate pills and water earlier with no nausea or vomiting.

## 2016-09-27 NOTE — ED Notes (Signed)
ED Provider at bedside. 

## 2016-09-27 NOTE — ED Triage Notes (Signed)
Pt here for body aches and N/V with HA x 4 days

## 2016-09-27 NOTE — ED Notes (Signed)
MD at bedside. 

## 2016-09-27 NOTE — ED Notes (Signed)
Pt departed in NAD, refused use of wheelchair.  

## 2018-01-11 IMAGING — DX DG ANKLE COMPLETE 3+V*L*
3 series · 3 of 3 positions shown · non-contrast
Comparison: None.

CLINICAL DATA: Fell last night, tripped over dog. Twisted LEFT
ankle.

EXAM:
LEFT ANKLE COMPLETE - 3+ VIEW

[ankle ap]
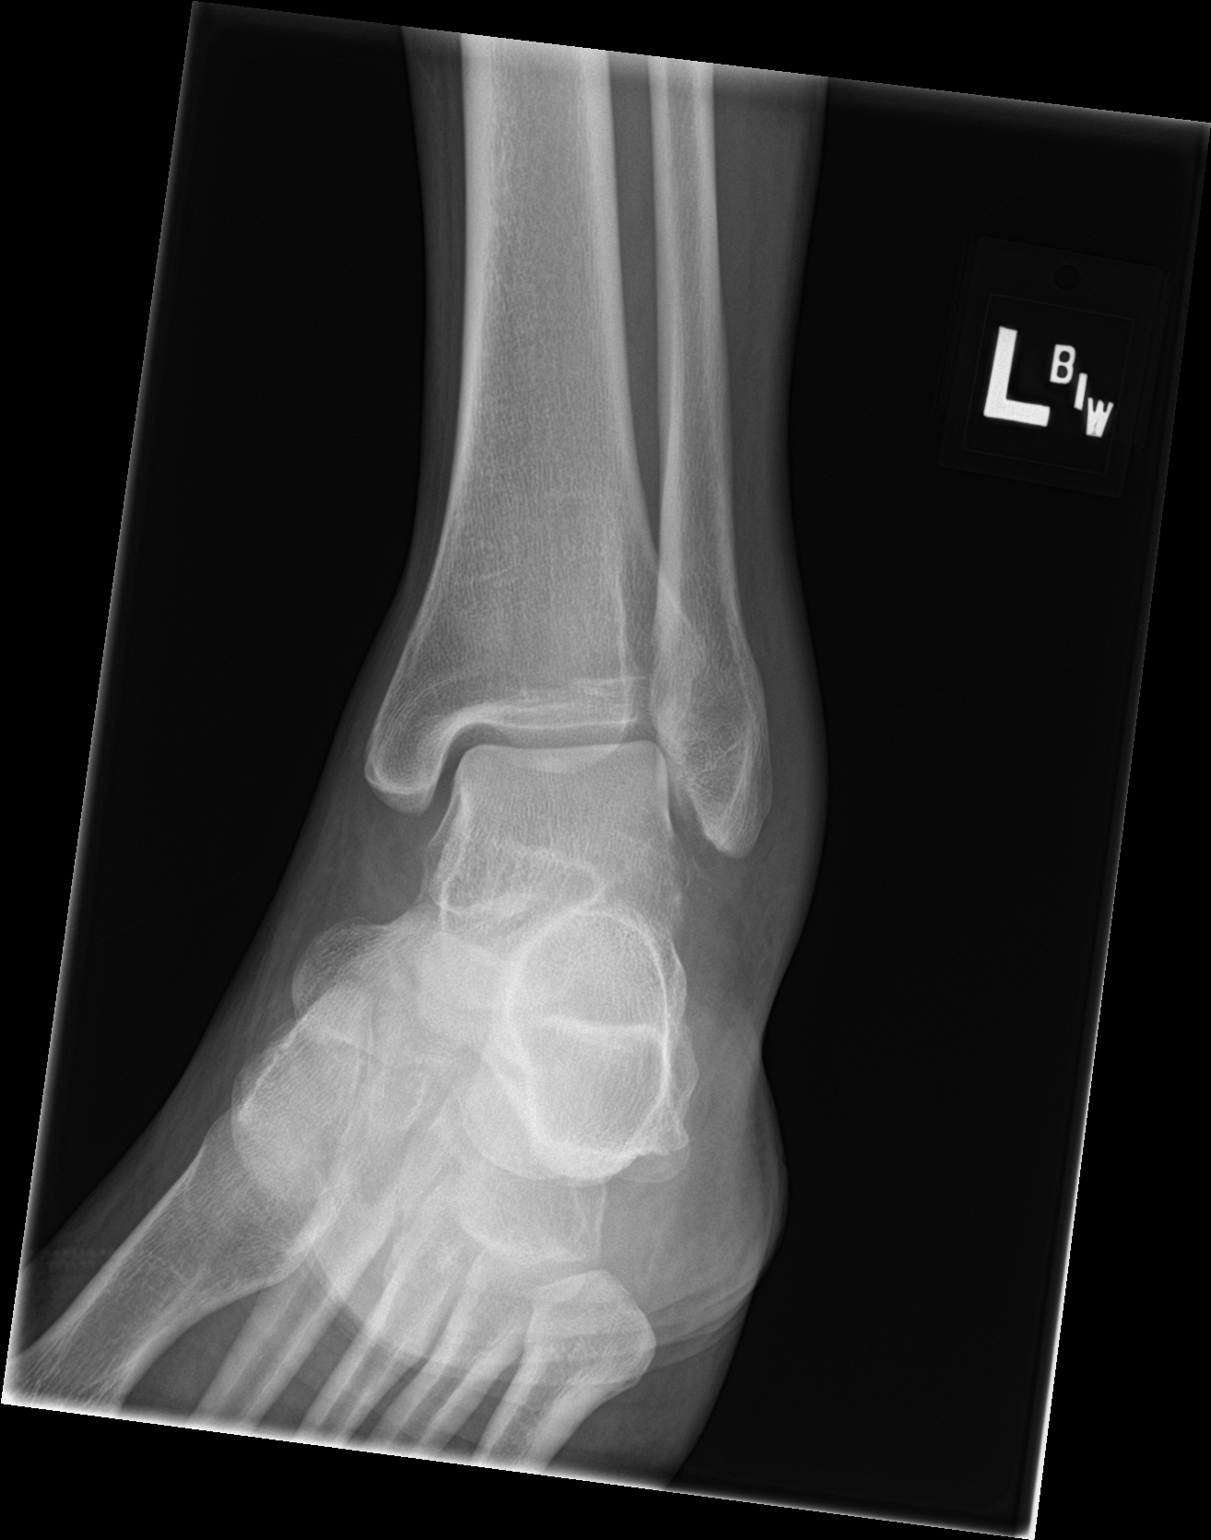

[ankle obl]
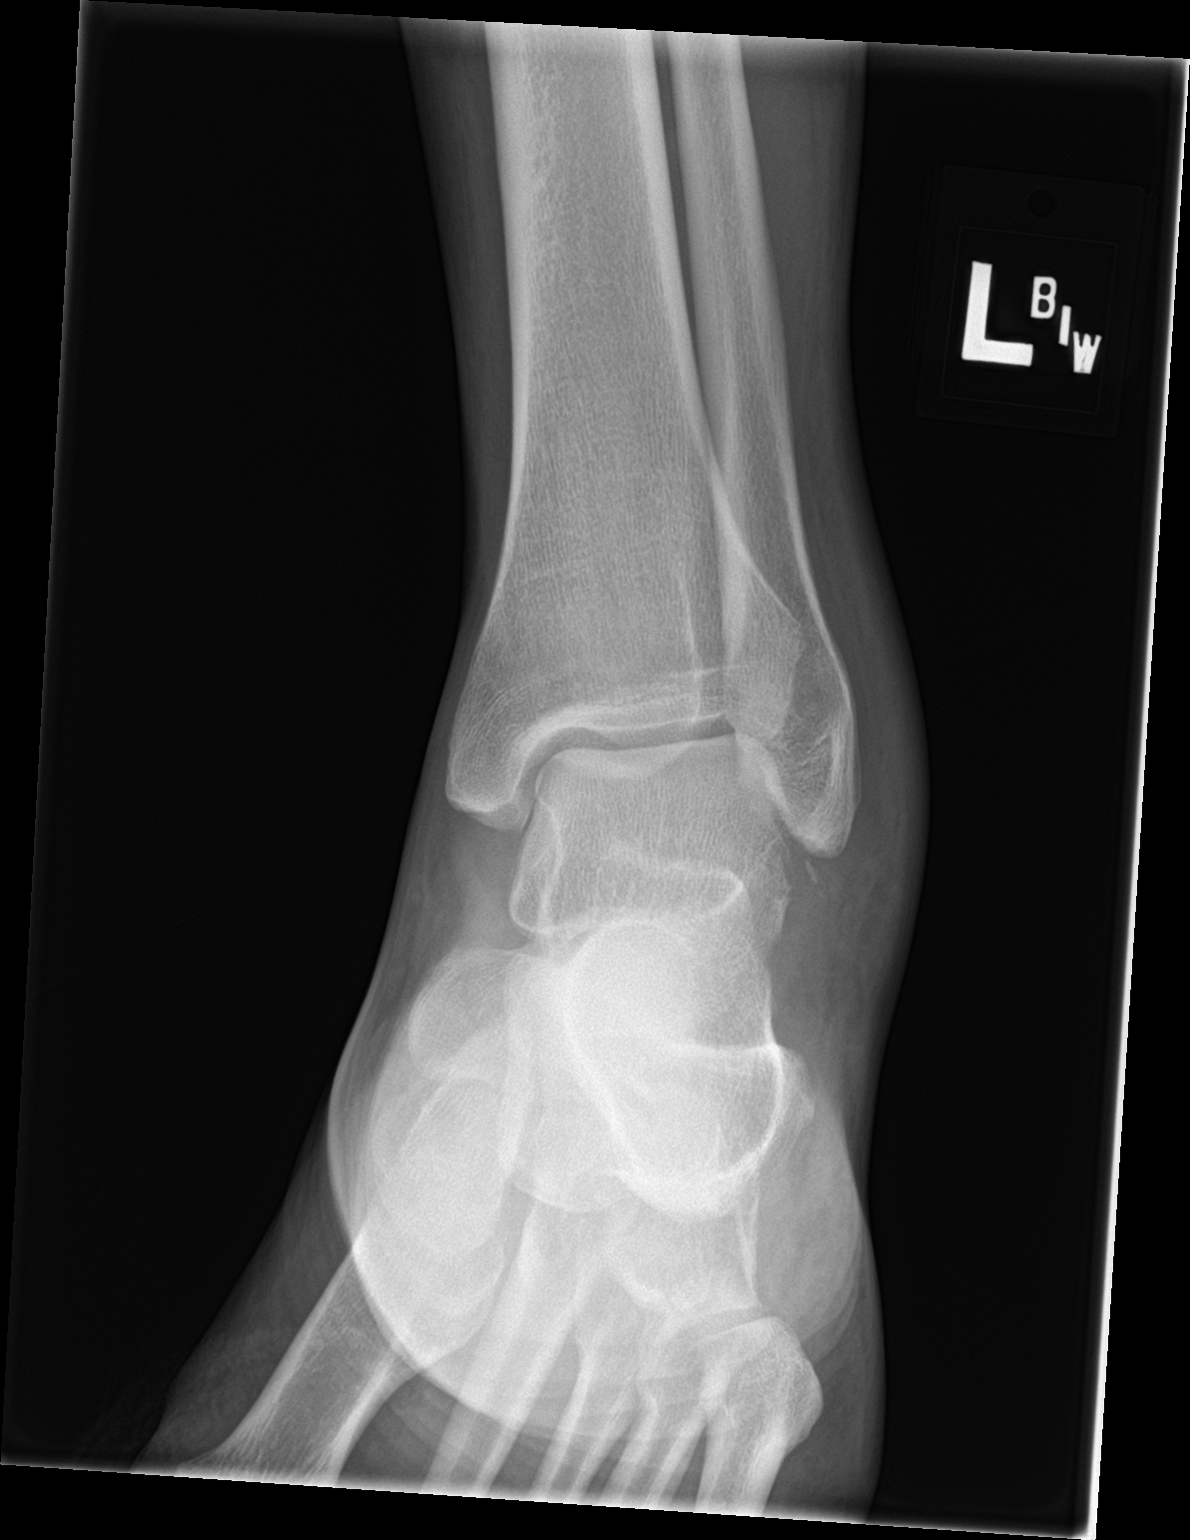

[ankle lat]
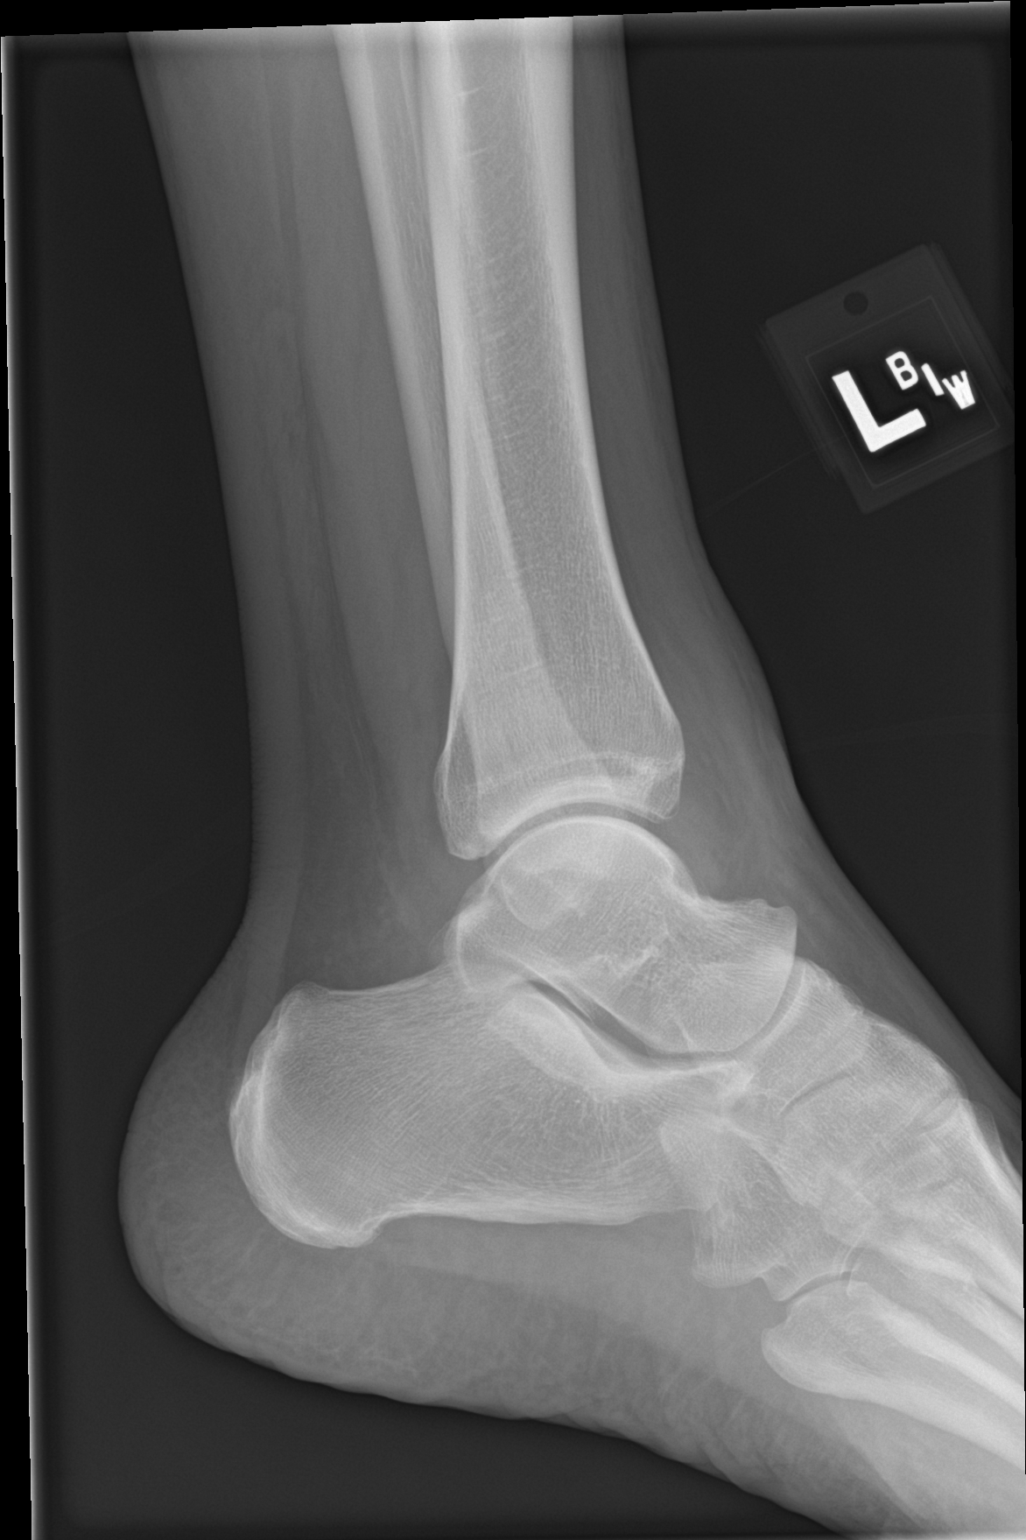

[3 of 3 positions shown; findings below may reference images not displayed]

FINDINGS: Crescentic small bony fragments project inferomedial to the lateral
malleolus with slight cortical irregularity of the lateral talus.
The ankle mortise appears congruent and the tibiofibular syndesmosis
intact. No destructive bony lesions. Lateral ankle soft tissue
swelling without subcutaneous gas or radiopaque foreign bodies.
IMPRESSION: Lateral talus versus lateral malleolus small avulsion fracture. No
dislocation.
